# Patient Record
Sex: Male | Born: 1950 | Race: White | Hispanic: No | Marital: Married | Smoking: Former smoker
Health system: Southern US, Community
[De-identification: ages and names within clinical notes are randomized; demographics above are authoritative.]

## PROBLEM LIST (undated history)

## (undated) DIAGNOSIS — M1990 Unspecified osteoarthritis, unspecified site: Secondary | ICD-10-CM

## (undated) DIAGNOSIS — N189 Chronic kidney disease, unspecified: Secondary | ICD-10-CM

## (undated) DIAGNOSIS — I10 Essential (primary) hypertension: Secondary | ICD-10-CM

## (undated) DIAGNOSIS — K219 Gastro-esophageal reflux disease without esophagitis: Secondary | ICD-10-CM

## (undated) DIAGNOSIS — C801 Malignant (primary) neoplasm, unspecified: Secondary | ICD-10-CM

## (undated) DIAGNOSIS — I509 Heart failure, unspecified: Secondary | ICD-10-CM

## (undated) DIAGNOSIS — K5790 Diverticulosis of intestine, part unspecified, without perforation or abscess without bleeding: Secondary | ICD-10-CM

## (undated) HISTORY — DX: Gastro-esophageal reflux disease without esophagitis: K21.9

## (undated) HISTORY — DX: Unspecified osteoarthritis, unspecified site: M19.90

## (undated) HISTORY — DX: Heart failure, unspecified: I50.9

## (undated) HISTORY — DX: Essential (primary) hypertension: I10

## (undated) HISTORY — PX: POLYPECTOMY: SHX149

## (undated) HISTORY — DX: Diverticulosis of intestine, part unspecified, without perforation or abscess without bleeding: K57.90

## (undated) HISTORY — DX: Malignant (primary) neoplasm, unspecified: C80.1

## (undated) HISTORY — DX: Chronic kidney disease, unspecified: N18.9

## (undated) HISTORY — PX: EYE SURGERY: SHX253

## (undated) HISTORY — PX: COLONOSCOPY: SHX174

---

## 2018-02-09 ENCOUNTER — Encounter: Payer: Self-pay | Admitting: Gastroenterology

## 2020-05-04 ENCOUNTER — Encounter: Payer: Self-pay | Admitting: Gastroenterology

## 2020-06-22 ENCOUNTER — Other Ambulatory Visit: Payer: Self-pay

## 2020-06-22 ENCOUNTER — Ambulatory Visit (AMBULATORY_SURGERY_CENTER): Payer: Self-pay | Admitting: *Deleted

## 2020-06-22 VITALS — Ht 72.0 in | Wt 270.0 lb

## 2020-06-22 DIAGNOSIS — Z01818 Encounter for other preprocedural examination: Secondary | ICD-10-CM

## 2020-06-22 DIAGNOSIS — Z86010 Personal history of colonic polyps: Secondary | ICD-10-CM

## 2020-06-22 MED ORDER — SUTAB 1479-225-188 MG PO TABS: 24.0000 | ORAL_TABLET | ORAL | 0 refills | Status: DC

## 2020-06-22 NOTE — Progress Notes (Signed)
No egg or soy allergy known to patient  No issues with past sedation with any surgeries or procedures no intubation problems in the past  No FH of Malignant Hyperthermia No diet pills per patient No home 02 use per patient  No blood thinners per patient  Pt denies issues with constipation  No A fib or A flutter  EMMI video to pt or via MyChart  COVID 19 guidelines implemented in PV today with Pt and RN   Sutab Coupon given to pt in PV today , Code to Pharmacy   Due to the COVID-19 pandemic we are asking patients to follow these guidelines. Please only bring one care partner. Please be aware that your care partner may wait in the car in the parking lot or if they feel like they will be too hot to wait in the car, they may wait in the lobby on the 4th floor. All care partners are required to wear a mask the entire time (we do not have any that we can provide them), they need to practice social distancing, and we will do a Covid check for all patient's and care partners when you arrive. Also we will check their temperature and your temperature. If the care partner waits in their car they need to stay in the parking lot the entire time and we will call them on their cell phone when the patient is ready for discharge so they can bring the car to the front of the building. Also all patient's will need to wear a mask into building.  

## 2020-06-23 ENCOUNTER — Encounter: Payer: Self-pay | Admitting: Gastroenterology

## 2020-06-27 ENCOUNTER — Telehealth: Payer: Self-pay

## 2020-06-27 NOTE — Telephone Encounter (Signed)
Called cardiology and he cancelled and no showed 2 appointments and PCP said they don't have record of patient seeing a cardiologist . Called patient himself and asked and he said he hasn't seen a cardiologist and also that he would like to cancel his appointment for procedure. I asked him if he wanted me to cancel the appointment and he said yes so procedure and covid test are cancelled. I told him to call back when he is ready to schedule again and he said ok

## 2020-06-27 NOTE — Telephone Encounter (Signed)
Brooke,  Thanks for following up  Thanks,  [employer]

## 2020-07-04 ENCOUNTER — Encounter: Payer: Self-pay | Admitting: Gastroenterology

## 2020-07-04 NOTE — Telephone Encounter (Signed)
Thanks for letting me know RG 

## 2021-01-11 ENCOUNTER — Telehealth: Payer: Self-pay | Admitting: Cardiology

## 2021-01-11 ENCOUNTER — Encounter (HOSPITAL_COMMUNITY): Payer: Self-pay | Admitting: Cardiology

## 2021-01-11 ENCOUNTER — Inpatient Hospital Stay (HOSPITAL_COMMUNITY): Payer: Medicare Other

## 2021-01-11 ENCOUNTER — Inpatient Hospital Stay (HOSPITAL_COMMUNITY)
Admission: EM | Admit: 2021-01-11 | Discharge: 2021-01-16 | DRG: 280 | Disposition: A | Discharge status: Home / Self Care | Payer: Medicare Other | Source: Other Acute Inpatient Hospital | Attending: Cardiology | Admitting: Cardiology

## 2021-01-11 DIAGNOSIS — I214 Non-ST elevation (NSTEMI) myocardial infarction: Principal | ICD-10-CM | POA: Diagnosis present

## 2021-01-11 DIAGNOSIS — I959 Hypotension, unspecified: Secondary | ICD-10-CM | POA: Diagnosis present

## 2021-01-11 DIAGNOSIS — E669 Obesity, unspecified: Secondary | ICD-10-CM | POA: Diagnosis present

## 2021-01-11 DIAGNOSIS — Z8249 Family history of ischemic heart disease and other diseases of the circulatory system: Secondary | ICD-10-CM | POA: Diagnosis not present

## 2021-01-11 DIAGNOSIS — Z79899 Other long term (current) drug therapy: Secondary | ICD-10-CM

## 2021-01-11 DIAGNOSIS — N179 Acute kidney failure, unspecified: Secondary | ICD-10-CM | POA: Diagnosis present

## 2021-01-11 DIAGNOSIS — E1122 Type 2 diabetes mellitus with diabetic chronic kidney disease: Secondary | ICD-10-CM | POA: Diagnosis present

## 2021-01-11 DIAGNOSIS — J432 Centrilobular emphysema: Secondary | ICD-10-CM | POA: Diagnosis present

## 2021-01-11 DIAGNOSIS — Z85828 Personal history of other malignant neoplasm of skin: Secondary | ICD-10-CM

## 2021-01-11 DIAGNOSIS — I5023 Acute on chronic systolic (congestive) heart failure: Secondary | ICD-10-CM | POA: Diagnosis present

## 2021-01-11 DIAGNOSIS — Z87891 Personal history of nicotine dependence: Secondary | ICD-10-CM

## 2021-01-11 DIAGNOSIS — U071 COVID-19: Secondary | ICD-10-CM | POA: Diagnosis present

## 2021-01-11 DIAGNOSIS — I130 Hypertensive heart and chronic kidney disease with heart failure and stage 1 through stage 4 chronic kidney disease, or unspecified chronic kidney disease: Secondary | ICD-10-CM | POA: Diagnosis present

## 2021-01-11 DIAGNOSIS — N1831 Chronic kidney disease, stage 3a: Secondary | ICD-10-CM | POA: Diagnosis present

## 2021-01-11 DIAGNOSIS — E782 Mixed hyperlipidemia: Secondary | ICD-10-CM | POA: Diagnosis present

## 2021-01-11 DIAGNOSIS — R072 Precordial pain: Secondary | ICD-10-CM | POA: Diagnosis present

## 2021-01-11 DIAGNOSIS — K219 Gastro-esophageal reflux disease without esophagitis: Secondary | ICD-10-CM | POA: Diagnosis present

## 2021-01-11 DIAGNOSIS — J9601 Acute respiratory failure with hypoxia: Secondary | ICD-10-CM | POA: Diagnosis not present

## 2021-01-11 DIAGNOSIS — Z882 Allergy status to sulfonamides status: Secondary | ICD-10-CM

## 2021-01-11 DIAGNOSIS — J439 Emphysema, unspecified: Secondary | ICD-10-CM | POA: Diagnosis not present

## 2021-01-11 DIAGNOSIS — Z8701 Personal history of pneumonia (recurrent): Secondary | ICD-10-CM

## 2021-01-11 DIAGNOSIS — Z6833 Body mass index (BMI) 33.0-33.9, adult: Secondary | ICD-10-CM

## 2021-01-11 DIAGNOSIS — R0902 Hypoxemia: Secondary | ICD-10-CM | POA: Diagnosis present

## 2021-01-11 DIAGNOSIS — M1990 Unspecified osteoarthritis, unspecified site: Secondary | ICD-10-CM | POA: Diagnosis present

## 2021-01-11 DIAGNOSIS — R079 Chest pain, unspecified: Secondary | ICD-10-CM

## 2021-01-11 DIAGNOSIS — U099 Post covid-19 condition, unspecified: Secondary | ICD-10-CM | POA: Diagnosis present

## 2021-01-11 DIAGNOSIS — Z880 Allergy status to penicillin: Secondary | ICD-10-CM

## 2021-01-11 DIAGNOSIS — J8410 Pulmonary fibrosis, unspecified: Secondary | ICD-10-CM | POA: Diagnosis present

## 2021-01-11 DIAGNOSIS — Z79891 Long term (current) use of opiate analgesic: Secondary | ICD-10-CM

## 2021-01-11 DIAGNOSIS — R0602 Shortness of breath: Secondary | ICD-10-CM

## 2021-01-11 DIAGNOSIS — I25110 Atherosclerotic heart disease of native coronary artery with unstable angina pectoris: Secondary | ICD-10-CM | POA: Diagnosis not present

## 2021-01-11 DIAGNOSIS — L4050 Arthropathic psoriasis, unspecified: Secondary | ICD-10-CM | POA: Diagnosis present

## 2021-01-11 DIAGNOSIS — E871 Hypo-osmolality and hyponatremia: Secondary | ICD-10-CM | POA: Diagnosis present

## 2021-01-11 DIAGNOSIS — I2510 Atherosclerotic heart disease of native coronary artery without angina pectoris: Secondary | ICD-10-CM | POA: Diagnosis present

## 2021-01-11 LAB — LIPID PANEL
Cholesterol: 179 mg/dL (ref 0–200)
HDL: 49 mg/dL (ref >40)
LDL Cholesterol: 109 mg/dL — ABNORMAL HIGH (ref 0–99)
Total CHOL/HDL Ratio: 3.7 RATIO
Triglycerides: 107 mg/dL (ref <150)
VLDL: 21 mg/dL (ref 0–40)

## 2021-01-11 LAB — CBC
HCT: 37.4 % — ABNORMAL LOW (ref 39.0–52.0)
Hemoglobin: 12.9 g/dL — ABNORMAL LOW (ref 13.0–17.0)
MCH: 32.3 pg (ref 26.0–34.0)
MCHC: 34.5 g/dL (ref 30.0–36.0)
MCV: 93.5 fL (ref 80.0–100.0)
Platelets: 211 10*3/uL (ref 150–400)
RBC: 4 MIL/uL — ABNORMAL LOW (ref 4.22–5.81)
RDW: 13.2 % (ref 11.5–15.5)
WBC: 7.9 10*3/uL (ref 4.0–10.5)
nRBC: 0 % (ref 0.0–0.2)

## 2021-01-11 LAB — COMPREHENSIVE METABOLIC PANEL
ALT: 37 U/L (ref 0–44)
AST: 72 U/L — ABNORMAL HIGH (ref 15–41)
Albumin: 3 g/dL — ABNORMAL LOW (ref 3.5–5.0)
Alkaline Phosphatase: 63 U/L (ref 38–126)
Anion gap: 9 (ref 5–15)
BUN: 22 mg/dL (ref 8–23)
CO2: 23 mmol/L (ref 22–32)
Calcium: 8.6 mg/dL — ABNORMAL LOW (ref 8.9–10.3)
Chloride: 100 mmol/L (ref 98–111)
Creatinine, Ser: 1.61 mg/dL — ABNORMAL HIGH (ref 0.61–1.24)
GFR, Estimated: 46 mL/min — ABNORMAL LOW (ref >60)
Glucose, Bld: 114 mg/dL — ABNORMAL HIGH (ref 70–99)
Potassium: 4.5 mmol/L (ref 3.5–5.1)
Sodium: 132 mmol/L — ABNORMAL LOW (ref 135–145)
Total Bilirubin: 1 mg/dL (ref 0.3–1.2)
Total Protein: 5.9 g/dL — ABNORMAL LOW (ref 6.5–8.1)

## 2021-01-11 LAB — BRAIN NATRIURETIC PEPTIDE: B Natriuretic Peptide: 491.7 pg/mL — ABNORMAL HIGH (ref 0.0–100.0)

## 2021-01-11 LAB — HEMOGLOBIN A1C
Hgb A1c MFr Bld: 5.3 % (ref 4.8–5.6)
Mean Plasma Glucose: 105.41 mg/dL

## 2021-01-11 LAB — TROPONIN I (HIGH SENSITIVITY): Troponin I (High Sensitivity): 12486 ng/L (ref <18)

## 2021-01-11 LAB — HIV ANTIBODY (ROUTINE TESTING W REFLEX): HIV Screen 4th Generation wRfx: NONREACTIVE

## 2021-01-11 LAB — HEPARIN LEVEL (UNFRACTIONATED): Heparin Unfractionated: 0.24 IU/mL — ABNORMAL LOW (ref 0.30–0.70)

## 2021-01-11 MED ORDER — FUROSEMIDE 10 MG/ML IJ SOLN
40.0000 mg | Freq: Two times a day (BID) | INTRAMUSCULAR | Status: DC
  Administered 2021-01-11 – 2021-01-14 (×4): 40 mg via INTRAVENOUS
  Filled 2021-01-11 (×5): qty 4

## 2021-01-11 MED ORDER — NITROGLYCERIN 0.4 MG SL SUBL: 0.4000 mg | SUBLINGUAL_TABLET | SUBLINGUAL | Status: DC | PRN

## 2021-01-11 MED ORDER — LISINOPRIL 20 MG PO TABS
20.0000 mg | ORAL_TABLET | Freq: Every day | ORAL | Status: DC
  Administered 2021-01-12: 20 mg via ORAL
  Filled 2021-01-11: qty 1

## 2021-01-11 MED ORDER — HYDROCODONE-ACETAMINOPHEN 10-325 MG PO TABS
1.0000 | ORAL_TABLET | Freq: Four times a day (QID) | ORAL | Status: DC | PRN
Start: 2021-01-11 — End: 2021-01-16
  Administered 2021-01-12 – 2021-01-15 (×2): 1 via ORAL
  Filled 2021-01-11 (×2): qty 1

## 2021-01-11 MED ORDER — CARVEDILOL 6.25 MG PO TABS
6.2500 mg | ORAL_TABLET | Freq: Two times a day (BID) | ORAL | Status: DC
  Administered 2021-01-11 – 2021-01-12 (×3): 6.25 mg via ORAL
  Filled 2021-01-11 (×3): qty 1

## 2021-01-11 MED ORDER — DOXAZOSIN MESYLATE 2 MG PO TABS
2.0000 mg | ORAL_TABLET | Freq: Every day | ORAL | Status: DC
  Administered 2021-01-12 – 2021-01-13 (×2): 2 mg via ORAL
  Filled 2021-01-11 (×3): qty 1

## 2021-01-11 MED ORDER — ACETAMINOPHEN 325 MG PO TABS: 650.0000 mg | ORAL_TABLET | ORAL | Status: DC | PRN

## 2021-01-11 MED ORDER — ASPIRIN 300 MG RE SUPP
300.0000 mg | RECTAL | Status: AC
  Filled 2021-01-11: qty 1

## 2021-01-11 MED ORDER — HEPARIN (PORCINE) 25000 UT/250ML-% IV SOLN
1800.0000 [IU]/h | INTRAVENOUS | Status: DC
  Administered 2021-01-11: 1500 [IU]/h via INTRAVENOUS
  Administered 2021-01-12: 1800 [IU]/h via INTRAVENOUS
  Filled 2021-01-11 (×2): qty 250

## 2021-01-11 MED ORDER — NITROGLYCERIN IN D5W 200-5 MCG/ML-% IV SOLN: 10.0000 ug/min | INTRAVENOUS | Status: DC

## 2021-01-11 MED ORDER — ASPIRIN EC 81 MG PO TBEC
81.0000 mg | DELAYED_RELEASE_TABLET | Freq: Every day | ORAL | Status: DC
  Administered 2021-01-13 – 2021-01-16 (×4): 81 mg via ORAL
  Filled 2021-01-11 (×6): qty 1

## 2021-01-11 MED ORDER — ONDANSETRON HCL 4 MG/2ML IJ SOLN: 4.0000 mg | Freq: Four times a day (QID) | INTRAMUSCULAR | Status: DC | PRN

## 2021-01-11 MED ORDER — ASPIRIN 81 MG PO CHEW
324.0000 mg | CHEWABLE_TABLET | ORAL | Status: AC
  Administered 2021-01-11: 324 mg via ORAL
  Filled 2021-01-11: qty 4

## 2021-01-11 MED ORDER — PANTOPRAZOLE SODIUM 40 MG PO TBEC
40.0000 mg | DELAYED_RELEASE_TABLET | Freq: Every day | ORAL | Status: DC
  Administered 2021-01-12 – 2021-01-16 (×5): 40 mg via ORAL
  Filled 2021-01-11 (×5): qty 1

## 2021-01-11 NOTE — H&P (Addendum)
Darrell Simpson is an 70 y.o. male.   Chief Complaint: Chest pain PCP: Antonietta Jewel, MD  HPI:   70 year old Caucasian male with hypertension, CKD, h/o squamous cell skin cancer, h/o COVID pneumonia in 11/2020, admitted with chest pain.  Patient is a retired Educational psychologist, works for [employer].  Patient had been in his usual state of health until 01/10/2021, when he developed retrosternal chest pain radiating to left arm and jaw, associated worsening shortness of breath.  Episode lasted for close to 2 hours.  Patient went to [facility] ED, where he underwent work-up that showed elevated troponin suggestive of acute coronary syndrome, age-indeterminate anterolateral infarct.  He also underwent CT chest angiogram that showed significant pulmonary abnormalities as detailed below, but no pulmonary embolism.  Work-up also showed hyponatremia at 126, elevated creatinine at 1.6.  Patient was transferred for further management of acute coronary syndrome.  After delay of close to 20 hours due to in availability of bed and/or transport, patient finally arrived at [facility] at around 5 PM on 01/11/2021.  At this time, patient is chest pain-free.  At rest, he denies any shortness of breath.  However, he does have baseline exertional dyspnea for last 3 years.  He has never had thorough pulmonary work-up.  He also reports exertional anginal symptoms for last 4 years.  Of note, he had COVID back in 11/2020. Antigen test was negative at [place] on 01/10/2021. PCR is still positive here. Confirmed with infection prevention. He does not need to be in isolation.    Past Medical History:  Diagnosis Date  . Arthritis    psioriatic arthritis   . Cancer ([place])    squamous cell skin cancer right cheek   . CHF (congestive heart failure) ([place])   . Chronic kidney disease    back to 80% func- improving per pt   . Diverticulosis   . GERD (gastroesophageal reflux disease)   . Hypertension     Past Surgical History:   Procedure Laterality Date  . COLONOSCOPY    . EYE SURGERY     age 39   . POLYPECTOMY       Family History  Problem Relation Age of Onset  . CAD Father   . Congestive Heart Failure Father   . Colon cancer Neg Hx   . Colon polyps Neg Hx   . Esophageal cancer Neg Hx   . Rectal cancer Neg Hx   . Stomach cancer Neg Hx     Social History:  reports that he quit smoking about 6 years ago. His smoking use included cigarettes. He has a 67.50 pack-year smoking history. He has quit using smokeless tobacco. He reports previous alcohol use of about 12.0 standard drinks of alcohol per week. He reports that he does not use drugs.  Allergies:  Allergies  Allergen Reactions  . Penicillins Rash  . Sulfa Antibiotics Rash    Review of Systems  Constitutional: Negative for decreased appetite, malaise/fatigue, weight gain and weight loss.  HENT: Negative for congestion.   Eyes: Negative for visual disturbance.  Cardiovascular: Positive for chest pain (Now resolved) and dyspnea on exertion. Negative for leg swelling, palpitations and syncope.  Respiratory: Negative for cough.   Endocrine: Negative for cold intolerance.  Hematologic/Lymphatic: Does not bruise/bleed easily.  Skin: Negative for itching and rash.  Musculoskeletal: Negative for myalgias.  Gastrointestinal: Negative for abdominal pain, nausea and vomiting.  Genitourinary: Negative for dysuria.  Neurological: Negative for dizziness and weakness.  Psychiatric/Behavioral: The patient  is not nervous/anxious.   All other systems reviewed and are negative.    Blood pressure 111/72, pulse 80, resp. rate (!) 22, height 6' (1.829 m), weight 112.1 kg, SpO2 97 %. Body mass index is 33.51 kg/m.  Physical Exam Vitals and nursing note reviewed.  Constitutional:      General: He is not in acute distress.    Appearance: He is well-developed.  HENT:     Head: Normocephalic and atraumatic.  Eyes:     Conjunctiva/sclera: Conjunctivae  normal.     Pupils: Pupils are equal, round, and reactive to light.  Neck:     Vascular: No JVD.  Cardiovascular:     Rate and Rhythm: Normal rate and regular rhythm.     Pulses: Normal pulses and intact distal pulses.     Heart sounds: No murmur heard.   Pulmonary:     Effort: Pulmonary effort is normal.     Breath sounds: Normal breath sounds. No wheezing or rales.  Abdominal:     General: Bowel sounds are normal.     Palpations: Abdomen is soft.     Tenderness: There is no rebound.  Musculoskeletal:        General: No tenderness. Normal range of motion.     Right lower leg: No edema.     Left lower leg: No edema.  Lymphadenopathy:     Cervical: No cervical adenopathy.  Skin:    General: Skin is warm and dry.  Neurological:     Mental Status: He is alert and oriented to person, place, and time.     Cranial Nerves: No cranial nerve deficit.     Results for orders placed or performed during the hospital encounter of 01/11/21 (from the past 48 hour(s))  HIV Antibody (routine testing w rflx)     Status: None   Collection Time: 01/11/21  5:06 PM  Result Value Ref Range   HIV Screen 4th Generation wRfx Non Reactive Non Reactive    Comment: Performed at [facility], [place] [address], [place], [place] [address]  Troponin I (High Sensitivity)     Status: Abnormal   Collection Time: 01/11/21  5:06 PM  Result Value Ref Range   Troponin I (High Sensitivity) 12,486 (HH) <18 ng/L    Comment: CRITICAL RESULT CALLED TO, READ BACK BY AND VERIFIED WITH: M.SCHULL RN 1847 01/11/21 MCCORMICK K (NOTE) Elevated high sensitivity troponin I (hsTnI) values and significant  changes across serial measurements may suggest ACS but many other  chronic and acute conditions are known to elevate hsTnI results.  Refer to the Links section for chest pain algorithms and additional  guidance. Performed at [facility], [place] [address], [place], [place] [address]     Labs:  No results  found for: WBC, HGB, HCT, MCV, PLT No results for input(s): NA, K, CL, CO2, BUN, CREATININE, CALCIUM, PROT, BILITOT, ALKPHOS, ALT, AST, GLUCOSE in the last 168 hours.  Invalid input(s): LABALBU  Lipid Panel  No results found for: CHOL, TRIG, HDL, CHOLHDL, VLDL, LDLCALC  BNP (last 3 results) No results for input(s): BNP in the last 8760 hours.  HEMOGLOBIN A1C No results found for: HGBA1C, MPG  Cardiac Panel (last 3 results) No results for input(s): CKTOTAL, CKMB, RELINDX in the last 8760 hours.  Invalid input(s): TROPONINHS  No results found for: CKTOTAL, CKMB, CKMBINDEX   TSH No results for input(s): TSH in the last 8760 hours.   Medications Prior to Admission  Medication  Sig Dispense Refill  . carvedilol (COREG) 6.25 MG tablet Take 6.25 mg by mouth 2 (two) times daily.    . Cyanocobalamin (VITAMIN B 12) 500 MCG TABS Take by mouth as needed. Pt takes 1-2 x a month    . doxazosin (CARDURA) 2 MG tablet Take by mouth.    . furosemide (LASIX) 20 MG tablet Take 20 mg by mouth 3 (three) times daily.    Marland Kitchen HYDROcodone-acetaminophen (NORCO) 10-325 MG tablet Take by mouth.    Marland Kitchen lisinopril (ZESTRIL) 20 MG tablet Take 20 mg by mouth daily.    Marland Kitchen omeprazole (PRILOSEC) 20 MG capsule Take 20 mg by mouth as needed.     . Sodium Sulfate-Mag Sulfate-KCl (SUTAB) [phone] MG TABS Take 24 tablets by mouth as directed. BIN: [id number] PCN: CN GROUP: [id number] MEMBER ID: [id number];OXB AS CASH;NO PRIOR AUTHORIZATION 24 tablet 0  . triamcinolone cream (KENALOG) 0.1 % SMARTSIG:1 Application Topical 2-3 Times Daily    . Vitamin D, Ergocalciferol, (DRISDOL) 1.25 MG (50000 UNIT) CAPS capsule Take 50,000 Units by mouth once a week.        Current Facility-Administered Medications:  .  acetaminophen (TYLENOL) tablet 650 mg, 650 mg, Oral, Q4H PRN, Kewana Sanon J, MD .  aspirin chewable tablet 324 mg, 324 mg, Oral, NOW **OR** aspirin suppository 300 mg, 300 mg, Rectal, NOW, Jaevin Medearis J,  MD .  Derrill Memo ON 01/12/2021] aspirin EC tablet 81 mg, 81 mg, Oral, Daily, Meridith Romick J, MD .  nitroGLYCERIN (NITROSTAT) SL tablet 0.4 mg, 0.4 mg, Sublingual, Q5 Min x 3 PRN, Abaigeal Moomaw J, MD .  ondansetron (ZOFRAN) injection 4 mg, 4 mg, Intravenous, Q6H PRN, Alyssabeth Bruster J, MD   Today's Vitals   01/11/21 1617  BP: 111/72  Pulse: 80  Resp: (!) 22  SpO2: 97%  Weight: 112.1 kg  Height: 6' (1.829 m)   Body mass index is 33.51 kg/m.   CXR 01/10/2021: Bibasilar interstitial and alveolar opacities compatible w/h/o COVID   CARDIAC STUDIES:  EKG 01/11/3021: Sinus rhythm LAFB Anterolateral infarct, age indeterminate  Echocardiogram: Pending  CTA Chest 3/2/202: No PE Patchy peripheral ground-glass opacities and subpleural reticulation, w/some associated architectural distortion b/l, suggesting persistent multifocal pneumonia superimposed on interstitial lung disease (UIP) and/or chronic post infectious change Numerous small and moderately enlarged lymph nodes throughout the mediastinum and b/l perihilar regions, likely reactive. Neoplastic process not excluded, but considered less likely Diffuse coronary artery calcifications  Recent labs: 01/10/2021: Glucose 218, BUN/Cr 23/1.6. EGFR 43. Na/K 126/4.8. Rest of the CMP normal H/H 14/40. MCV 94. Platelets 235 HbA1C N/A Lipid panel N/A TSH N/A Ur osm 254 (low) COVID Ag test negative (At [facility])  Assessment/Plan  70 year old Caucasian male with hypertension, CKD, h/o squamous cell skin cancer, h/o COVID pneumonia in 11/2020, likely underlying interstitial lung disease, admitted with non-STEMI  Non-STEMI: High-sensitivity troponin 12,000 Currently chest pain-free.  Age-indeterminate anterolateral infarct and LAFB on EKG. Recommend aspirin, heparin, beta-blocker, statin. Echocardiogram pending. Plan for coronary angiogram and possible intervention on 01/12/2021. Given patient's possible underlying interstitial  lung disease, he is less likely to be a good surgical candidate, in case of severe multivessel disease. Recommend gentle hydration with 50 cc an hour for next 12 hours  Hyponatremia: Sodium 126 at [place].  Suspect dilutional hyponatremia given urine osmolality of 254. We will judiciously use Lasix.  CKD: Stage III.  GFR 43.  Gentle hydration and judicious use of Lasix, to maintain urine output.  Interstitial lung disease: History of COVID and  possible underlying interstitial lung disease.   Appreciate pulmonary input.  Of note, he had COVID back in 11/2020. Antigen test was negative at [place] on 01/10/2021. PCR is still positive here. Confirmed with infection prevention. He does not need to be in isolation.  Reviewed outside records Time spent: 90-minute   Nigel Mormon, MD Pager: [phone] Office: [phone]

## 2021-01-11 NOTE — Telephone Encounter (Signed)
Received a call from [facility]. Presentation with chest pain, currently chest pain free. Trop 4 (old assay). Awaiting transfer subject to bed availability, Continue Aspirin, heparin, statin, beta blocker.   Nigel Mormon, MD Pager: [phone] Office: [phone]

## 2021-01-11 NOTE — Progress Notes (Addendum)
ANTICOAGULATION CONSULT NOTE - Initial Consult  Pharmacy Consult for heparin  Indication: chest pain/ACS  Allergies  Allergen Reactions  . Penicillins Rash  . Sulfa Antibiotics Rash    Patient Measurements: Height: 6' (182.9 cm) Weight: 112.1 kg (247 lb 1.6 oz) IBW/kg (Calculated) : 77.6 HEPARIN DW (KG): 101.5   Vital Signs:    Labs: No results for input(s): HGB, HCT, PLT, APTT, LABPROT, INR, HEPARINUNFRC, HEPRLOWMOCWT, CREATININE, CKTOTAL, CKMB, TROPONINIHS in the last 72 hours.  CrCl cannot be calculated (No successful lab value found.).   Medical History: Past Medical History:  Diagnosis Date  . Arthritis    psioriatic arthritis   . Cancer ([place])    squamous cell skin cancer right cheek   . CHF (congestive heart failure) ([place])   . Chronic kidney disease    back to 80% func- improving per pt   . Diverticulosis   . GERD (gastroesophageal reflux disease)   . Hypertension     Medications:  Medications Prior to Admission  Medication Sig Dispense Refill Last Dose  . carvedilol (COREG) 6.25 MG tablet Take 6.25 mg by mouth 2 (two) times daily.     . Cyanocobalamin (VITAMIN B 12) 500 MCG TABS Take by mouth as needed. Pt takes 1-2 x a month     . doxazosin (CARDURA) 2 MG tablet Take by mouth.     . furosemide (LASIX) 20 MG tablet Take 20 mg by mouth 3 (three) times daily.     Marland Kitchen HYDROcodone-acetaminophen (NORCO) 10-325 MG tablet Take by mouth.     Marland Kitchen lisinopril (ZESTRIL) 20 MG tablet Take 20 mg by mouth daily.     Marland Kitchen omeprazole (PRILOSEC) 20 MG capsule Take 20 mg by mouth as needed.      . Sodium Sulfate-Mag Sulfate-KCl (SUTAB) [phone] MG TABS Take 24 tablets by mouth as directed. BIN: [id number] PCN: CN GROUP: [id number] MEMBER ID: [id number];DVV AS CASH;NO PRIOR AUTHORIZATION 24 tablet 0   . triamcinolone cream (KENALOG) 0.1 % SMARTSIG:1 Application Topical 2-3 Times Daily     . Vitamin D, Ergocalciferol, (DRISDOL) 1.25 MG (50000 UNIT) CAPS capsule Take 50,000 Units by  mouth once a week.      Scheduled:  . aspirin  324 mg Oral NOW   Or  . aspirin  300 mg Rectal NOW  . [START ON 01/12/2021] aspirin EC  81 mg Oral Daily    Assessment: 70 yo male from [facility] hospital with CP. Pharmacy consulted to dose heparin. He is currently in heparin at 1500 units/hr   Goal of Therapy:  Heparin level 0.3-0.7 units/ml Monitor platelets by anticoagulation protocol: Yes   Plan:  -Continue heparin at 1500 units/hr -Check heparin level later tonight -Daily heparin level and CBC  Hildred Laser, PharmD Clinical Pharmacist **Pharmacist phone directory can now be found on [email or web address] ([email or web address]).  Listed under [place].  Addendum -heparin level= 0.24  Plan  -Increase heparin to 1700 units/hr -Heparin level in 6 hours and daily wth CBC daily  Hildred Laser, PharmD Clinical Pharmacist **Pharmacist phone directory can now be found on [email or web address] (PW [email or web address]).  Listed under [place].

## 2021-01-12 ENCOUNTER — Inpatient Hospital Stay (HOSPITAL_COMMUNITY): Payer: Medicare Other

## 2021-01-12 ENCOUNTER — Telehealth: Payer: Self-pay | Admitting: Internal Medicine

## 2021-01-12 ENCOUNTER — Ambulatory Visit (HOSPITAL_COMMUNITY): Admit: 2021-01-12 | Payer: Medicare Other | Admitting: Cardiology

## 2021-01-12 ENCOUNTER — Encounter (HOSPITAL_COMMUNITY)
Admission: EM | Disposition: A | Discharge status: Home / Self Care | Payer: Self-pay | Source: Other Acute Inpatient Hospital | Attending: Cardiology

## 2021-01-12 ENCOUNTER — Other Ambulatory Visit: Payer: Self-pay

## 2021-01-12 ENCOUNTER — Encounter (HOSPITAL_COMMUNITY): Payer: Self-pay | Admitting: Cardiology

## 2021-01-12 DIAGNOSIS — J432 Centrilobular emphysema: Secondary | ICD-10-CM

## 2021-01-12 DIAGNOSIS — U071 COVID-19: Secondary | ICD-10-CM

## 2021-01-12 DIAGNOSIS — I214 Non-ST elevation (NSTEMI) myocardial infarction: Principal | ICD-10-CM

## 2021-01-12 DIAGNOSIS — J9601 Acute respiratory failure with hypoxia: Secondary | ICD-10-CM | POA: Diagnosis not present

## 2021-01-12 HISTORY — PX: LEFT HEART CATH AND CORONARY ANGIOGRAPHY: CATH118249

## 2021-01-12 LAB — BASIC METABOLIC PANEL
Anion gap: 10 (ref 5–15)
BUN: 20 mg/dL (ref 8–23)
CO2: 20 mmol/L — ABNORMAL LOW (ref 22–32)
Calcium: 8.7 mg/dL — ABNORMAL LOW (ref 8.9–10.3)
Chloride: 101 mmol/L (ref 98–111)
Creatinine, Ser: 1.41 mg/dL — ABNORMAL HIGH (ref 0.61–1.24)
GFR, Estimated: 54 mL/min — ABNORMAL LOW (ref >60)
Glucose, Bld: 103 mg/dL — ABNORMAL HIGH (ref 70–99)
Potassium: 4.4 mmol/L (ref 3.5–5.1)
Sodium: 131 mmol/L — ABNORMAL LOW (ref 135–145)

## 2021-01-12 LAB — CBC WITH DIFFERENTIAL/PLATELET
Abs Immature Granulocytes: 0.04 10*3/uL (ref 0.00–0.07)
Basophils Absolute: 0.1 10*3/uL (ref 0.0–0.1)
Basophils Relative: 1 %
Eosinophils Absolute: 0.2 10*3/uL (ref 0.0–0.5)
Eosinophils Relative: 2 %
HCT: 37.5 % — ABNORMAL LOW (ref 39.0–52.0)
Hemoglobin: 12.9 g/dL — ABNORMAL LOW (ref 13.0–17.0)
Immature Granulocytes: 1 %
Lymphocytes Relative: 25 %
Lymphs Abs: 2.1 10*3/uL (ref 0.7–4.0)
MCH: 32.2 pg (ref 26.0–34.0)
MCHC: 34.4 g/dL (ref 30.0–36.0)
MCV: 93.5 fL (ref 80.0–100.0)
Monocytes Absolute: 0.8 10*3/uL (ref 0.1–1.0)
Monocytes Relative: 9 %
Neutro Abs: 5.3 10*3/uL (ref 1.7–7.7)
Neutrophils Relative %: 62 %
Platelets: 198 10*3/uL (ref 150–400)
RBC: 4.01 MIL/uL — ABNORMAL LOW (ref 4.22–5.81)
RDW: 13.4 % (ref 11.5–15.5)
WBC: 8.4 10*3/uL (ref 4.0–10.5)
nRBC: 0 % (ref 0.0–0.2)

## 2021-01-12 LAB — ECHOCARDIOGRAM COMPLETE
AR max vel: 2.35 cm2
AV Area VTI: 2.6 cm2
AV Area mean vel: 2.41 cm2
AV Mean grad: 4 mmHg
AV Peak grad: 7.3 mmHg
Ao pk vel: 1.35 m/s
Area-P 1/2: 2.09 cm2
Height: 72 in
S' Lateral: 3.6 cm
Weight: 3886.4 oz

## 2021-01-12 LAB — SARS CORONAVIRUS 2 (TAT 6-24 HRS): SARS Coronavirus 2: POSITIVE — AB

## 2021-01-12 LAB — HEPARIN LEVEL (UNFRACTIONATED): Heparin Unfractionated: 0.29 IU/mL — ABNORMAL LOW (ref 0.30–0.70)

## 2021-01-12 SURGERY — LEFT HEART CATH AND CORONARY ANGIOGRAPHY
Anesthesia: LOCAL

## 2021-01-12 MED ORDER — ALBUTEROL SULFATE HFA 108 (90 BASE) MCG/ACT IN AERS
1.0000 | INHALATION_SPRAY | RESPIRATORY_TRACT | Status: DC | PRN
  Filled 2021-01-12: qty 6.7

## 2021-01-12 MED ORDER — HEPARIN (PORCINE) IN NACL 1000-0.9 UT/500ML-% IV SOLN
INTRAVENOUS | Status: DC | PRN
  Administered 2021-01-12 (×2): 500 mL

## 2021-01-12 MED ORDER — HEPARIN (PORCINE) 25000 UT/250ML-% IV SOLN
1900.0000 [IU]/h | INTRAVENOUS | Status: DC
  Administered 2021-01-12: 23:00:00 1800 [IU]/h via INTRAVENOUS
  Filled 2021-01-12 (×3): qty 250

## 2021-01-12 MED ORDER — FENTANYL CITRATE (PF) 100 MCG/2ML IJ SOLN
INTRAMUSCULAR | Status: AC
  Filled 2021-01-12: qty 2

## 2021-01-12 MED ORDER — ONDANSETRON HCL 4 MG/2ML IJ SOLN: 4.0000 mg | Freq: Four times a day (QID) | INTRAMUSCULAR | Status: DC | PRN

## 2021-01-12 MED ORDER — VERAPAMIL HCL 2.5 MG/ML IV SOLN
INTRAVENOUS | Status: AC
  Filled 2021-01-12: qty 2

## 2021-01-12 MED ORDER — HEPARIN (PORCINE) IN NACL 1000-0.9 UT/500ML-% IV SOLN
INTRAVENOUS | Status: AC
  Filled 2021-01-12: qty 1000

## 2021-01-12 MED ORDER — LIDOCAINE HCL (PF) 1 % IJ SOLN
INTRAMUSCULAR | Status: AC
  Filled 2021-01-12: qty 30

## 2021-01-12 MED ORDER — SODIUM CHLORIDE 0.9 % IV SOLN
INTRAVENOUS | Status: AC
  Administered 2021-01-12 (×2): 250 mL via INTRAVENOUS

## 2021-01-12 MED ORDER — MIDAZOLAM HCL 2 MG/2ML IJ SOLN
INTRAMUSCULAR | Status: DC | PRN
  Administered 2021-01-12: 1 mg via INTRAVENOUS

## 2021-01-12 MED ORDER — ASPIRIN 81 MG PO CHEW
81.0000 mg | CHEWABLE_TABLET | ORAL | Status: AC
  Administered 2021-01-12: 81 mg via ORAL
  Filled 2021-01-12: qty 1

## 2021-01-12 MED ORDER — FENTANYL CITRATE (PF) 100 MCG/2ML IJ SOLN
INTRAMUSCULAR | Status: DC | PRN
  Administered 2021-01-12: 50 ug via INTRAVENOUS

## 2021-01-12 MED ORDER — SODIUM CHLORIDE 0.9% FLUSH
3.0000 mL | INTRAVENOUS | Status: DC | PRN
  Administered 2021-01-12: 3 mL via INTRAVENOUS

## 2021-01-12 MED ORDER — PERFLUTREN LIPID MICROSPHERE
1.0000 mL | INTRAVENOUS | Status: AC | PRN
  Administered 2021-01-12: 3 mL via INTRAVENOUS
  Filled 2021-01-12: qty 10

## 2021-01-12 MED ORDER — LABETALOL HCL 5 MG/ML IV SOLN: 10.0000 mg | INTRAVENOUS | Status: AC | PRN

## 2021-01-12 MED ORDER — MIDAZOLAM HCL 2 MG/2ML IJ SOLN
INTRAMUSCULAR | Status: AC
  Filled 2021-01-12: qty 2

## 2021-01-12 MED ORDER — VERAPAMIL HCL 2.5 MG/ML IV SOLN
INTRAVENOUS | Status: DC | PRN
  Administered 2021-01-12 (×3): 10 mL via INTRA_ARTERIAL

## 2021-01-12 MED ORDER — ONDANSETRON HCL 4 MG/2ML IJ SOLN
INTRAMUSCULAR | Status: AC
  Filled 2021-01-12: qty 2

## 2021-01-12 MED ORDER — SODIUM CHLORIDE 0.9 % IV SOLN: 250.0000 mL | INTRAVENOUS | Status: DC | PRN

## 2021-01-12 MED ORDER — ONDANSETRON HCL 4 MG/2ML IJ SOLN
INTRAMUSCULAR | Status: DC | PRN
  Administered 2021-01-12: 4 mg via INTRAVENOUS

## 2021-01-12 MED ORDER — SODIUM CHLORIDE 0.9% FLUSH: 3.0000 mL | INTRAVENOUS | Status: DC | PRN

## 2021-01-12 MED ORDER — HEPARIN SODIUM (PORCINE) 1000 UNIT/ML IJ SOLN
INTRAMUSCULAR | Status: AC
  Filled 2021-01-12: qty 1

## 2021-01-12 MED ORDER — HEPARIN SODIUM (PORCINE) 1000 UNIT/ML IJ SOLN
INTRAMUSCULAR | Status: DC | PRN
  Administered 2021-01-12: 6000 [IU] via INTRAVENOUS

## 2021-01-12 MED ORDER — SODIUM CHLORIDE 0.9 % IV SOLN: INTRAVENOUS | Status: AC

## 2021-01-12 MED ORDER — SODIUM CHLORIDE 0.9% FLUSH
3.0000 mL | Freq: Two times a day (BID) | INTRAVENOUS | Status: DC
  Administered 2021-01-12 – 2021-01-15 (×7): 3 mL via INTRAVENOUS

## 2021-01-12 MED ORDER — SODIUM CHLORIDE 0.9% FLUSH
3.0000 mL | Freq: Two times a day (BID) | INTRAVENOUS | Status: DC
  Administered 2021-01-11 – 2021-01-12 (×2): 3 mL via INTRAVENOUS

## 2021-01-12 MED ORDER — IOHEXOL 350 MG/ML SOLN
INTRAVENOUS | Status: DC | PRN
  Administered 2021-01-12: 75 mL via INTRA_ARTERIAL

## 2021-01-12 MED ORDER — LIDOCAINE HCL (PF) 1 % IJ SOLN
INTRAMUSCULAR | Status: DC | PRN
  Administered 2021-01-12: 1 mL

## 2021-01-12 MED ORDER — ACETAMINOPHEN 325 MG PO TABS: 650.0000 mg | ORAL_TABLET | ORAL | Status: DC | PRN

## 2021-01-12 MED ORDER — HYDRALAZINE HCL 20 MG/ML IJ SOLN: 10.0000 mg | INTRAMUSCULAR | Status: AC | PRN

## 2021-01-12 SURGICAL SUPPLY — 15 items
CATH 5FR JL3.5 JR4 ANG PIG MP (CATHETERS) ×2 IMPLANT
CATH INFINITI 5 FR 3DRC (CATHETERS) ×2 IMPLANT
CATH INFINITI 5 FR AL2 (CATHETERS) ×2 IMPLANT
CATH INFINITI 5FR AL1 (CATHETERS) ×2 IMPLANT
CATH INFINITI 5FR JL4 (CATHETERS) ×2 IMPLANT
CATH LAUNCHER 5F RADR (CATHETERS) ×1 IMPLANT
CATHETER LAUNCHER 5F RADR (CATHETERS) ×2
DEVICE RAD COMP TR BAND LRG (VASCULAR PRODUCTS) ×2 IMPLANT
GLIDESHEATH SLEND A-KIT 6F 22G (SHEATH) ×2 IMPLANT
GUIDEWIRE INQWIRE 1.5J.035X260 (WIRE) ×1 IMPLANT
INQWIRE 1.5J .035X260CM (WIRE) ×2
KIT HEART LEFT (KITS) ×2 IMPLANT
PACK CARDIAC CATHETERIZATION (CUSTOM PROCEDURE TRAY) ×2 IMPLANT
TRANSDUCER W/STOPCOCK (MISCELLANEOUS) ×2 IMPLANT
TUBING CIL FLEX 10 FLL-RA (TUBING) ×2 IMPLANT

## 2021-01-12 NOTE — Consult Note (Signed)
NAME:  Darrell Simpson, MRN:  [id number], DOB:  [birth year], LOS: 1 ADMISSION DATE:  01/11/2021, CONSULTATION DATE:  01/12/21 REFERRING MD:  Virgina Jock - Cards, CHIEF COMPLAINT:  Dyspnea, Hx COVID   Brief History:  70 yo M admitted to [facility] from [place] for NSTEMI. Hx COVID (+ 11/12/20) and has had persistent dyspnea. CCM consult in this setting  History of Present Illness:  70 yo M PMH HTN, CKD, recent covid PNA who transferred to [facility] 3/3 from [place] for Chest pain-- admitted to cards service for NSTEMI. Chest discomfort has been longstanding with varying qualities (burning, cramping, pain) x 3+ years. Assocaited dyspnea for 2-3 years most notably DOE. Quit smoking 8 years ago (formerly was 1ppd x "over 20 years") but did start vaping when he quit smoking. Quit smoking Jan 2022 after contracting COVID. Recent Hx COVID (positive 11/12/20), after which pt was started on PRN albuterol which he does not feel helps his SOB. 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   CCM consulted 3/4 for dyspnea.   Past Medical History:  CKD Squamous cell carcinoma  Hx COVID PNA Psoriatic arthritis GERD HTN \\CHF  Diverticulosis  Hx tobacco dependence  Significant Hospital Events:  3/3 transferred from [facility] hospital to [facility] for NSTEMI. 3/4 PCCM consult for DOE, CTA chest findings. Plan for LHC with cards  Consults:  PCCM  Procedures:    Significant Diagnostic Tests:  3/2 CTA chest at OSH: Emphysematous changes. patchy bilateral GGO and subpleural reticulation.  No evidence of PE on CTA. Numerous small and moderately enlarged mediastinal lymph notes, perihilar nodes.   Micro Data:  COVID PCR 3/3 positive >> d/w IP >> pt does NOT require precautions (covid  + 11/12/20)   Antimicrobials:    Interim History / Subjective:  Taken off of isolation, verified by IP   Objective   Blood pressure 110/75, pulse 71, temperature 98.3  F (36.8 C), temperature source Oral, resp. rate 20, height 6' (1.829 m), weight 110.2 kg, SpO2 97 %.        Intake/Output Summary (Last 24 hours) at 01/12/2021 1016 Last data filed at 01/12/2021 0924 Gross per 24 hour  Intake 1610.88 ml  Output 2326 ml  Net -715.12 ml   Filed Weights   01/11/21 1617 01/12/21 0430  Weight: 112.1 kg 110.2 kg    Examination: General: Chronically older adult M, reclined in bed NAD  HENT: NCAT. Poor dentition. Anicteric sclera Lungs: CTAb Even and unlabored  Cardiovascular: rrr cap refill brisk  Abdomen: soft round ndnt  Extremities: no acute deformity, no cyanosis or clubbing.  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  -has some enlarged lymph notes on CT-- suspect reactive  P -dc contact/airborne precautions -needs OP pulm follow up & PFTs -IS  -PRN albuterol       Best practice (evaluated daily)  Diet: NPO for cath  Pain/Anxiety/Delirium protocol (if indicated): prn VAP protocol (if indicated): na DVT prophylaxis: heparin gtt GI prophylaxis: protonix Glucose control: monitor Mobility: per primary Disposition: cardiac tele  Goals of Care:  Last date of multidisciplinary goals of care discussion:-- Family and staff present: -- Summary of discussion:  Follow up goals of care discussion due: per primary Code Status: Full   Labs   CBC: Recent  Labs  Lab 01/11/21 2030 01/12/21 0433  WBC 7.9 8.4  NEUTROABS  --  5.3  HGB 12.9* 12.9*  HCT 37.4* 37.5*  MCV 93.5 93.5  PLT 211 267    Basic Metabolic Panel: Recent Labs  Lab 01/11/21 2030 01/12/21 0433  NA 132* 131*  K 4.5 4.4  CL 100 101  CO2 23 20*  GLUCOSE 114* 103*  BUN 22 20  CREATININE 1.61* 1.41*  CALCIUM 8.6* 8.7*   GFR: Estimated Creatinine Clearance: 64.3 mL/min (A) (by C-G formula based on SCr of 1.41 mg/dL (H)). Recent Labs  Lab 01/11/21 2030 01/12/21 0433  WBC 7.9 8.4    Liver Function Tests: Recent Labs  Lab 01/11/21 2030  AST 72*  ALT 37  ALKPHOS 63  BILITOT 1.0  PROT 5.9*  ALBUMIN 3.0*   No results for input(s): LIPASE, AMYLASE in the last 168 hours. No results for input(s): AMMONIA in the last 168 hours.  ABG No results found for: PHART, PCO2ART, PO2ART, HCO3, TCO2, ACIDBASEDEF, O2SAT   Coagulation Profile: No results for input(s): INR, PROTIME in the last 168 hours.  Cardiac Enzymes: No results for input(s): CKTOTAL, CKMB, CKMBINDEX, TROPONINI in the last 168 hours.  HbA1C: Hgb A1c MFr Bld  Date/Time Value Ref Range Status  01/11/2021 08:30 PM 5.3 4.8 - 5.6 % Final    Comment:    (NOTE) Pre diabetes:          5.7%-6.4%  Diabetes:              >6.4%  Glycemic control for   <7.0% adults with diabetes     CBG: No results for input(s): GLUCAP in the last 168 hours.  Review of Systems:   As per HPI   Past Medical History:  He,  has a past medical history of Arthritis, Cancer ([place]), CHF (congestive heart failure) ([place]), Chronic kidney disease, Diverticulosis, GERD (gastroesophageal reflux disease), and Hypertension.   Surgical History:   Past Surgical History:  Procedure Laterality Date  . COLONOSCOPY    . EYE SURGERY     age 56   . POLYPECTOMY       Social History:   reports that he quit smoking about 6 years ago. His smoking use included cigarettes. He has a 67.50 pack-year smoking history. He has quit using  smokeless tobacco. He reports previous alcohol use of about 12.0 standard drinks of alcohol per week. He reports that he does not use drugs.   Family History:  His family history includes CAD in his father; Congestive Heart Failure in his father. There is no history of Colon cancer, Colon polyps, Esophageal cancer, Rectal cancer, or Stomach cancer.   Allergies Allergies  Allergen Reactions  . Penicillins Rash  . Sulfa Antibiotics Rash     Home Medications  Prior to Admission medications   Medication Sig Start Date End Date Taking? Authorizing Provider  albuterol (VENTOLIN HFA) 108 (90 Base) MCG/ACT inhaler Inhale 2 puffs into the lungs every 4 (four) hours as needed for wheezing or shortness of breath.   Yes [provider]  carvedilol (COREG)  6.25 MG tablet Take 6.25 mg by mouth 2 (two) times daily. 06/14/20  Yes [provider]  ciprofloxacin (CIPRO) 500 MG tablet Take 500 mg by mouth 2 (two) times daily. 01/04/21  Yes [provider]  Cyanocobalamin (VITAMIN B 12) 500 MCG TABS Take by mouth as needed. Pt takes 1-2 x a month   Yes [provider]  doxazosin (CARDURA) 2 MG tablet Take by mouth. 06/14/20  Yes [provider]  furosemide (LASIX) 20 MG tablet Take 20 mg by mouth 3 (three) times daily. 06/14/20  Yes [provider]  HYDROcodone-acetaminophen (NORCO) 10-325 MG tablet Take 1 tablet by mouth every 4 (four) hours as needed for moderate pain. 06/14/20  Yes [provider]  lisinopril (ZESTRIL) 20 MG tablet Take 20 mg by mouth daily. 02/28/20  Yes [provider]  methocarbamol (ROBAXIN) 500 MG tablet Take 500 mg by mouth every 6 (six) hours as needed for muscle spasms.   Yes [provider]  omeprazole (PRILOSEC) 20 MG capsule Take 20 mg by mouth as needed.  06/14/20  Yes [provider]  predniSONE (DELTASONE) 10 MG tablet Take 10 mg by mouth See admin instructions. Take 6 tablets by mouth on day 1, then  decrease by one tablet daily until all gone   Yes [provider]  Vitamin D, Ergocalciferol, (DRISDOL) 1.25 MG (50000 UNIT) CAPS capsule Take 50,000 Units by mouth once a week. 06/14/20  Yes [provider]      Eliseo Gum MSN, AGACNP-BC [place] [phone] If no answer, [phone] 01/12/2021, 11:30 AM

## 2021-01-12 NOTE — Telephone Encounter (Signed)
Needs hospital follow up 

## 2021-01-12 NOTE — Progress Notes (Signed)
Darrell Simpson for heparin  Indication: chest pain/ACS  Allergies  Allergen Reactions  . Penicillins Rash  . Sulfa Antibiotics Rash    Patient Measurements: Height: 6' (182.9 cm) Weight: 110.2 kg (242 lb 14.4 oz) IBW/kg (Calculated) : 77.6 HEPARIN DW (KG): 101.5   Vital Signs: Temp: 98.2 F (36.8 C) (03/04 1701) Temp Source: Oral (03/04 1701) BP: 121/73 (03/04 1701) Pulse Rate: 76 (03/04 1701)  Labs: Recent Labs    01/11/21 1706 01/11/21 2030 01/12/21 0433  HGB  --  12.9* 12.9*  HCT  --  37.4* 37.5*  PLT  --  211 198  HEPARINUNFRC  --  0.24* 0.29*  CREATININE  --  1.61* 1.41*  TROPONINIHS 12,486*  --   --     Estimated Creatinine Clearance: 64.3 mL/min (A) (by C-G formula based on SCr of 1.41 mg/dL (H)).   Assessment: 70 yo male from [place] with CP. Pharmacy consulted to dose heparin. He is now s/p cath w/ multivessel CAD for consideration for CABG. Sheath removed ~ 4:30pm -last heparin rate was 1800 units/hr    Goal of Therapy:  Heparin level 0.3-0.7 units/ml Monitor platelets by anticoagulation protocol: Yes  Plan  -Restart heparin 1800 units/hr at 10:30pm -Heparin level in 6 hours and daily wth CBC daily  Hildred Laser, PharmD Clinical Pharmacist **Pharmacist phone directory can now be found on [email or web address] ([email or web address]).  Listed under [place].

## 2021-01-12 NOTE — Progress Notes (Signed)
  Echocardiogram 2D Echocardiogram has been performed with [facility].  Darrell Simpson 01/12/2021, 10:27 AM

## 2021-01-12 NOTE — Progress Notes (Signed)
[place] for heparin  Indication: chest pain/ACS  Allergies  Allergen Reactions  . Penicillins Rash  . Sulfa Antibiotics Rash    Patient Measurements: Height: 6' (182.9 cm) Weight: 110.2 kg (242 lb 14.4 oz) IBW/kg (Calculated) : 77.6 HEPARIN DW (KG): 101.5   Vital Signs: Temp: 98 F (36.7 C) (03/04 0430) Temp Source: Oral (03/04 0430) BP: 100/63 (03/04 0430) Pulse Rate: 72 (03/04 0430)  Labs: Recent Labs    01/11/21 1706 01/11/21 2030 01/12/21 0433  HGB  --  12.9* 12.9*  HCT  --  37.4* 37.5*  PLT  --  211 198  HEPARINUNFRC  --  0.24* 0.29*  CREATININE  --  1.61* 1.41*  TROPONINIHS 12,486*  --   --     Estimated Creatinine Clearance: 64.3 mL/min (A) (by C-G formula based on SCr of 1.41 mg/dL (H)).   Assessment: 70 yo male from [place] with CP. Pharmacy consulted to dose heparin.   Heparin level slightly subtherapeutic (0.29) on gtt at 1700 units/hr. No issues with line or bleeding reported per RN.  Goal of Therapy:  Heparin level 0.3-0.7 units/ml Monitor platelets by anticoagulation protocol: Yes  Plan  Increase heparin to 1800 units/hr F/u post cath  Sherlon Handing, PharmD, BCPS Please see amion for complete clinical pharmacist phone list 01/12/2021 5:47 AM

## 2021-01-12 NOTE — Plan of Care (Signed)
  Problem: Skin Integrity: Goal: Risk for impaired skin integrity will decrease Outcome: Completed/Met   Problem: Safety: Goal: Ability to remain free from injury will improve Outcome: Completed/Met   Problem: Pain Managment: Goal: General experience of comfort will improve Outcome: Completed/Met   Problem: Coping: Goal: Level of anxiety will decrease Outcome: Completed/Met   Problem: Nutrition: Goal: Adequate nutrition will be maintained Outcome: Completed/Met

## 2021-01-12 NOTE — Progress Notes (Signed)
Pt's COVID test came back positive, but no S/S, pt did just have COVID in Jan this year, on call MD notified, will continue to monitor, Thanks Arvella Nigh RN.

## 2021-01-12 NOTE — Interval H&P Note (Signed)
History and Physical Interval Note:  01/12/2021 3:03 PM  Darrell Simpson  has presented today for surgery, with the diagnosis of chest pain.  The various methods of treatment have been discussed with the patient and family. After consideration of risks, benefits and other options for treatment, the patient has consented to  Procedure(s): LEFT HEART CATH AND CORONARY ANGIOGRAPHY (N/A) as a surgical intervention.  The patient's history has been reviewed, patient examined, no change in status, stable for surgery.  I have reviewed the patient's chart and labs.  Questions were answered to the patient's satisfaction.    2016 Appropriate Use Criteria for Coronary Revascularization in Patients With Acute Coronary Syndrome NSTEMI/UA High Risk (TIMI Score 5-7) NSTEMI/Unstable angina, stabilized patient at high risk Link Here: [facility] Indication:  Revascularization by PCI or CABG of 1 or more arteries in a patient with NSTEMI or unstable angina with Stabilization after presentation High risk for clinical events  A (7) Indication: 16; Score 7   [place]

## 2021-01-12 NOTE — Progress Notes (Signed)
Per MD, Pt had COVID back in 11/2020. Antigen test was negative at [facility] on 01/10/2021. PCR is still positive here. Confirmed with infection prevention. He does not need to be in isolation.

## 2021-01-13 ENCOUNTER — Inpatient Hospital Stay (HOSPITAL_COMMUNITY): Payer: Medicare Other

## 2021-01-13 DIAGNOSIS — I214 Non-ST elevation (NSTEMI) myocardial infarction: Secondary | ICD-10-CM

## 2021-01-13 DIAGNOSIS — I25110 Atherosclerotic heart disease of native coronary artery with unstable angina pectoris: Secondary | ICD-10-CM

## 2021-01-13 LAB — BASIC METABOLIC PANEL
Anion gap: 10 (ref 5–15)
BUN: 22 mg/dL (ref 8–23)
CO2: 22 mmol/L (ref 22–32)
Calcium: 8.6 mg/dL — ABNORMAL LOW (ref 8.9–10.3)
Chloride: 99 mmol/L (ref 98–111)
Creatinine, Ser: 2 mg/dL — ABNORMAL HIGH (ref 0.61–1.24)
GFR, Estimated: 36 mL/min — ABNORMAL LOW (ref >60)
Glucose, Bld: 93 mg/dL (ref 70–99)
Potassium: 4.2 mmol/L (ref 3.5–5.1)
Sodium: 131 mmol/L — ABNORMAL LOW (ref 135–145)

## 2021-01-13 LAB — CBC WITH DIFFERENTIAL/PLATELET
Abs Immature Granulocytes: 0.05 10*3/uL (ref 0.00–0.07)
Basophils Absolute: 0 10*3/uL (ref 0.0–0.1)
Basophils Relative: 1 %
Eosinophils Absolute: 0.2 10*3/uL (ref 0.0–0.5)
Eosinophils Relative: 3 %
HCT: 35.9 % — ABNORMAL LOW (ref 39.0–52.0)
Hemoglobin: 13 g/dL (ref 13.0–17.0)
Immature Granulocytes: 1 %
Lymphocytes Relative: 30 %
Lymphs Abs: 2 10*3/uL (ref 0.7–4.0)
MCH: 33.3 pg (ref 26.0–34.0)
MCHC: 36.2 g/dL — ABNORMAL HIGH (ref 30.0–36.0)
MCV: 92.1 fL (ref 80.0–100.0)
Monocytes Absolute: 0.6 10*3/uL (ref 0.1–1.0)
Monocytes Relative: 9 %
Neutro Abs: 3.8 10*3/uL (ref 1.7–7.7)
Neutrophils Relative %: 56 %
Platelets: 176 10*3/uL (ref 150–400)
RBC: 3.9 MIL/uL — ABNORMAL LOW (ref 4.22–5.81)
RDW: 13.3 % (ref 11.5–15.5)
WBC: 6.7 10*3/uL (ref 4.0–10.5)
nRBC: 0 % (ref 0.0–0.2)

## 2021-01-13 LAB — HEPARIN LEVEL (UNFRACTIONATED): Heparin Unfractionated: 0.29 IU/mL — ABNORMAL LOW (ref 0.30–0.70)

## 2021-01-13 LAB — BRAIN NATRIURETIC PEPTIDE: B Natriuretic Peptide: 237.1 pg/mL — ABNORMAL HIGH (ref 0.0–100.0)

## 2021-01-13 MED ORDER — RANOLAZINE ER 500 MG PO TB12
1000.0000 mg | ORAL_TABLET | Freq: Two times a day (BID) | ORAL | Status: DC
  Administered 2021-01-13 – 2021-01-16 (×6): 1000 mg via ORAL
  Filled 2021-01-13 (×8): qty 2

## 2021-01-13 MED ORDER — ATORVASTATIN CALCIUM 80 MG PO TABS
80.0000 mg | ORAL_TABLET | Freq: Every day | ORAL | Status: DC
  Administered 2021-01-13 – 2021-01-16 (×4): 80 mg via ORAL
  Filled 2021-01-13 (×2): qty 1
  Filled 2021-01-13: qty 2
  Filled 2021-01-13: qty 1

## 2021-01-13 MED ORDER — HEPARIN SODIUM (PORCINE) 5000 UNIT/ML IJ SOLN
5000.0000 [IU] | Freq: Three times a day (TID) | INTRAMUSCULAR | Status: DC
  Administered 2021-01-13 – 2021-01-16 (×7): 5000 [IU] via SUBCUTANEOUS
  Filled 2021-01-13 (×7): qty 1

## 2021-01-13 MED ORDER — SODIUM CHLORIDE 0.9 % IV BOLUS
150.0000 mL | Freq: Once | INTRAVENOUS | Status: AC
  Administered 2021-01-13: 150 mL via INTRAVENOUS

## 2021-01-13 MED ORDER — TICAGRELOR 90 MG PO TABS
90.0000 mg | ORAL_TABLET | Freq: Two times a day (BID) | ORAL | Status: DC
  Administered 2021-01-13 – 2021-01-16 (×7): 90 mg via ORAL
  Filled 2021-01-13 (×8): qty 1

## 2021-01-13 MED ORDER — ISOSORBIDE DINITRATE 30 MG PO TABS
30.0000 mg | ORAL_TABLET | Freq: Two times a day (BID) | ORAL | Status: DC
  Administered 2021-01-13 – 2021-01-15 (×4): 30 mg via ORAL
  Filled 2021-01-13 (×6): qty 1

## 2021-01-13 MED ORDER — CARVEDILOL 3.125 MG PO TABS
3.1250 mg | ORAL_TABLET | Freq: Two times a day (BID) | ORAL | Status: DC
  Administered 2021-01-13 – 2021-01-16 (×6): 3.125 mg via ORAL
  Filled 2021-01-13 (×4): qty 1

## 2021-01-13 NOTE — Progress Notes (Signed)
Patient's blood pressure is 72/41.  Paged on-call doctor. No response at this time.  Will continue to monitor.   Rico Sheehan, RN

## 2021-01-13 NOTE — Progress Notes (Signed)
[place] for heparin  Indication: chest pain/ACS  Allergies  Allergen Reactions  . Penicillins Rash  . Sulfa Antibiotics Rash    Patient Measurements: Height: 6' (182.9 cm) Weight: 111.9 kg (246 lb 11.1 oz) IBW/kg (Calculated) : 77.6 HEPARIN DW (KG): 101.5   Vital Signs: Temp: 98 F (36.7 C) (03/05 0410) Temp Source: Oral (03/05 0410) BP: 80/50 (03/05 0410) Pulse Rate: 56 (03/05 0410)  Labs: Recent Labs    01/11/21 1706 01/11/21 2030 01/11/21 2030 01/12/21 0433 01/13/21 0524  HGB  --  12.9*   < > 12.9* 13.0  HCT  --  37.4*  --  37.5* 35.9*  PLT  --  211  --  198 176  HEPARINUNFRC  --  0.24*  --  0.29* 0.29*  CREATININE  --  1.61*  --  1.41*  --   TROPONINIHS 12,486*  --   --   --   --    < > = values in this interval not displayed.    Estimated Creatinine Clearance: 64.8 mL/min (A) (by C-G formula based on SCr of 1.41 mg/dL (H)).   Assessment: 70 yo male from [place] with CP. Pharmacy consulted to dose heparin. He is now s/p cath w/ multivessel CAD for consideration for CABG. Sheath removed ~ 4:30pm -last heparin rate was 1800 units/hr  Heparin level slightly subtherapeutic (0.29) on gtt at 1800 units/hr. No issues with line or bleeding reported per RN.  Goal of Therapy:  Heparin level 0.3-0.7 units/ml Monitor platelets by anticoagulation protocol: Yes  Plan  Increase heparin to 1900 units/hr F/u 6 hr heparin level  Sherlon Handing, PharmD, BCPS Please see amion for complete clinical pharmacist phone list 01/13/2021 6:14 AM

## 2021-01-13 NOTE — Consult Note (Signed)
Reason for Consult:3 vessel CAD s/p non-STEMI Referring Physician: Dr. [name] is an 70 y.o. male.  HPI: 70 yo man presented with CP  Darrell Simpson is a 70 yo man with a history of hypertension, tobacco abuse (quit 2016), reflux, psoriatic arthritis and stage III CKD. No prior history of CAD. He has been having issues with shortness of breath with exertion for about 3 years. Recently would walk about a minute then have to rest for 3 minutes. Had COVID 19 in January. Was pretty much in bed for 3 weeks. Did not get treated. Has not been active since then.  On 3/2 he developed sudden onset of substernal CP radiating to necc, jaw, and left arm. Went to [facility] ED. Elevated troponin, r/i for NSTEMI. Had CT angiogram which did not show PE but did show significant lung disease.  Transferred to [facility]. Cath yesterday showed 50% left main, CTO of LAD, 80% proximal stenosis in dominant circumflex and 90% in nondominant RCA. EF 40% by Echo.  Past Medical History:  Diagnosis Date  . Arthritis    psioriatic arthritis   . Cancer ([place])    squamous cell skin cancer right cheek   . CHF (congestive heart failure) ([place])   . Chronic kidney disease    back to 80% func- improving per pt   . Diverticulosis   . GERD (gastroesophageal reflux disease)   . Hypertension     Past Surgical History:  Procedure Laterality Date  . COLONOSCOPY    . EYE SURGERY     age 73   . POLYPECTOMY      Family History  Problem Relation Age of Onset  . CAD Father   . Congestive Heart Failure Father   . Colon cancer Neg Hx   . Colon polyps Neg Hx   . Esophageal cancer Neg Hx   . Rectal cancer Neg Hx   . Stomach cancer Neg Hx     Social History:  reports that he quit smoking about 6 years ago. His smoking use included cigarettes. He has a 67.50 pack-year smoking history. He has quit using smokeless tobacco. He reports previous alcohol use of about 12.0 standard drinks of alcohol per week. He reports that he  does not use drugs.  Allergies:  Allergies  Allergen Reactions  . Penicillins Rash  . Sulfa Antibiotics Rash    Medications:  Scheduled: . aspirin EC  81 mg Oral Daily  . atorvastatin  80 mg Oral Daily  . carvedilol  3.125 mg Oral BID  . doxazosin  2 mg Oral Daily  . furosemide  40 mg Intravenous BID  . heparin  5,000 Units Subcutaneous Q8H  . isosorbide dinitrate  30 mg Oral BID  . pantoprazole  40 mg Oral Daily  . ranolazine  1,000 mg Oral BID  . sodium chloride flush  3 mL Intravenous Q12H    Results for orders placed or performed during the hospital encounter of 01/11/21 (from the past 48 hour(s))  HIV Antibody (routine testing w rflx)     Status: None   Collection Time: 01/11/21  5:06 PM  Result Value Ref Range   HIV Screen 4th Generation wRfx Non Reactive Non Reactive    Comment: Performed at [facility], [place] [address] [place], [place] [address]  Troponin I (High Sensitivity)     Status: Abnormal   Collection Time: 01/11/21  5:06 PM  Result Value Ref Range   Troponin I (High Sensitivity) 12,486 (  HH) <18 ng/L    Comment: CRITICAL RESULT CALLED TO, READ BACK BY AND VERIFIED WITH: Penalosa RN [id number] 01/11/21 MCCORMICK K (NOTE) Elevated high sensitivity troponin I (hsTnI) values and significant  changes across serial measurements may suggest ACS but many other  chronic and acute conditions are known to elevate hsTnI results.  Refer to the Links section for chest pain algorithms and additional  guidance. Performed at [facility], [place] [address] [place], [place] [address]   Heparin level (unfractionated)     Status: Abnormal   Collection Time: 01/11/21  8:30 PM  Result Value Ref Range   Heparin Unfractionated 0.24 (L) 0.30 - 0.70 IU/mL    Comment: (NOTE) If heparin results are below expected values, and patient dosage has  been confirmed, suggest follow up testing of antithrombin III levels. Performed at [facility], [place] [address],  [place], [place] [address]   CBC     Status: Abnormal   Collection Time: 01/11/21  8:30 PM  Result Value Ref Range   WBC 7.9 4.0 - 10.5 K/uL   RBC 4.00 (L) 4.22 - 5.81 MIL/uL   Hemoglobin 12.9 (L) 13.0 - 17.0 g/dL   HCT 37.4 (L) 39.0 - 52.0 %   MCV 93.5 80.0 - 100.0 fL   MCH 32.3 26.0 - 34.0 pg   MCHC 34.5 30.0 - 36.0 g/dL   RDW 13.2 11.5 - 15.5 %   Platelets 211 150 - 400 K/uL   nRBC 0.0 0.0 - 0.2 %    Comment: Performed at [facility], [place] [address] [place], [place] [address]  Comprehensive metabolic panel     Status: Abnormal   Collection Time: 01/11/21  8:30 PM  Result Value Ref Range   Sodium 132 (L) 135 - 145 mmol/L   Potassium 4.5 3.5 - 5.1 mmol/L   Chloride 100 98 - 111 mmol/L   CO2 23 22 - 32 mmol/L   Glucose, Bld 114 (H) 70 - 99 mg/dL    Comment: Glucose reference range applies only to samples taken after fasting for at least 8 hours.   BUN 22 8 - 23 mg/dL   Creatinine, Ser 1.61 (H) 0.61 - 1.24 mg/dL   Calcium 8.6 (L) 8.9 - 10.3 mg/dL   Total Protein 5.9 (L) 6.5 - 8.1 g/dL   Albumin 3.0 (L) 3.5 - 5.0 g/dL   AST 72 (H) 15 - 41 U/L   ALT 37 0 - 44 U/L   Alkaline Phosphatase 63 38 - 126 U/L   Total Bilirubin 1.0 0.3 - 1.2 mg/dL   GFR, Estimated 46 (L) >60 mL/min    Comment: (NOTE) Calculated using the CKD-EPI Creatinine Equation (2021)    Anion gap 9 5 - 15    Comment: Performed at [facility], [place] [address] [place], [place] [address]  Hemoglobin A1c     Status: None   Collection Time: 01/11/21  8:30 PM  Result Value Ref Range   Hgb A1c MFr Bld 5.3 4.8 - 5.6 %    Comment: (NOTE) Pre diabetes:          5.7%-6.4%  Diabetes:              >6.4%  Glycemic control for   <7.0% adults with diabetes    Mean Plasma Glucose 105.41 mg/dL    Comment: Performed at [facility] [address], [place], [place] [address]  Lipid panel     Status:  Abnormal   Collection Time: 01/11/21  8:30 PM  Result Value Ref Range   Cholesterol 179 0 - 200 mg/dL    Triglycerides 107 <150 mg/dL   HDL 49 >40 mg/dL   Total CHOL/HDL Ratio 3.7 RATIO   VLDL 21 0 - 40 mg/dL   LDL Cholesterol 109 (H) 0 - 99 mg/dL    Comment:        Total Cholesterol/HDL:CHD Risk Coronary Heart Disease Risk Table                     Men   Women  1/2 Average Risk   3.4   3.3  Average Risk       5.0   4.4  2 X Average Risk   9.6   7.1  3 X Average Risk  23.4   11.0        Use the calculated Patient Ratio above and the CHD Risk Table to determine the patient's CHD Risk.        ATP III CLASSIFICATION (LDL):  <100     mg/dL   Optimal  100-129  mg/dL   Near or Above                    Optimal  130-159  mg/dL   Borderline  160-189  mg/dL   High  >190     mg/dL   Very High Performed at [facility] [address], [place], [place] [address]   Brain natriuretic peptide     Status: Abnormal   Collection Time: 01/11/21  8:31 PM  Result Value Ref Range   B Natriuretic Peptide 491.7 (H) 0.0 - 100.0 pg/mL    Comment: Performed at [facility] [address] [place], [place] [address]  SARS CORONAVIRUS 2 (TAT 6-24 HRS) Nasopharyngeal Nasopharyngeal Swab     Status: Abnormal   Collection Time: 01/11/21  9:05 PM   Specimen: Nasopharyngeal Swab  Result Value Ref Range   SARS Coronavirus 2 POSITIVE (A) NEGATIVE    Comment: (NOTE) SARS-CoV-2 target nucleic acids are DETECTED.  The SARS-CoV-2 RNA is generally detectable in upper and lower respiratory specimens during the acute phase of infection. Positive results are indicative of the presence of SARS-CoV-2 RNA. Clinical correlation with patient history and other diagnostic information is  necessary to determine patient infection status. Positive results do not rule out bacterial infection or co-infection with other viruses.  The expected result is Negative.  Fact Sheet for Patients: SugarRoll.be  Fact Sheet for Healthcare  Providers: https://www.woods-mathews.com/  This test is not yet approved or cleared by the [place] FDA and  has been authorized for detection and/or diagnosis of SARS-CoV-2 by FDA under an Emergency Use Authorization (EUA). This EUA will remain  in effect (meaning this test can be used) for the duration of the COVID-19 declaration under Section 564(b)(1) of the Act, 21 U. S.C. section 360bbb-3(b)(1), unless the authorization is terminated or revoked sooner.   Performed at [facility], [place] [address], [place], [place] [address]   Basic metabolic panel     Status: Abnormal   Collection Time: 01/12/21  4:33 AM  Result Value Ref Range   Sodium 131 (L) 135 - 145 mmol/L   Potassium 4.4 3.5 - 5.1 mmol/L   Chloride 101 98 - 111 mmol/L   CO2 20 (L) 22 - 32 mmol/L   Glucose, Bld 103 (H) 70 - 99 mg/dL  Comment: Glucose reference range applies only to samples taken after fasting for at least 8 hours.   BUN 20 8 - 23 mg/dL   Creatinine, Ser 1.41 (H) 0.61 - 1.24 mg/dL   Calcium 8.7 (L) 8.9 - 10.3 mg/dL   GFR, Estimated 54 (L) >60 mL/min    Comment: (NOTE) Calculated using the CKD-EPI Creatinine Equation (2021)    Anion gap 10 5 - 15    Comment: Performed at [facility] [address], [place], [place] [address]  CBC WITH DIFFERENTIAL     Status: Abnormal   Collection Time: 01/12/21  4:33 AM  Result Value Ref Range   WBC 8.4 4.0 - 10.5 K/uL   RBC 4.01 (L) 4.22 - 5.81 MIL/uL   Hemoglobin 12.9 (L) 13.0 - 17.0 g/dL   HCT 37.5 (L) 39.0 - 52.0 %   MCV 93.5 80.0 - 100.0 fL   MCH 32.2 26.0 - 34.0 pg   MCHC 34.4 30.0 - 36.0 g/dL   RDW 13.4 11.5 - 15.5 %   Platelets 198 150 - 400 K/uL   nRBC 0.0 0.0 - 0.2 %   Neutrophils Relative % 62 %   Neutro Abs 5.3 1.7 - 7.7 K/uL   Lymphocytes Relative 25 %   Lymphs Abs 2.1 0.7 - 4.0 K/uL   Monocytes Relative 9 %   Monocytes Absolute 0.8 0.1 - 1.0 K/uL   Eosinophils Relative 2 %   Eosinophils Absolute 0.2 0.0 - 0.5  K/uL   Basophils Relative 1 %   Basophils Absolute 0.1 0.0 - 0.1 K/uL   Immature Granulocytes 1 %   Abs Immature Granulocytes 0.04 0.00 - 0.07 K/uL    Comment: Performed at [facility], [address] [place], [place] [address]  Heparin level (unfractionated)     Status: Abnormal   Collection Time: 01/12/21  4:33 AM  Result Value Ref Range   Heparin Unfractionated 0.29 (L) 0.30 - 0.70 IU/mL    Comment: (NOTE) If heparin results are below expected values, and patient dosage has  been confirmed, suggest follow up testing of antithrombin III levels. Performed at [facility], [place] [address], [place], [place] [address]   Basic metabolic panel     Status: Abnormal   Collection Time: 01/13/21  5:24 AM  Result Value Ref Range   Sodium 131 (L) 135 - 145 mmol/L   Potassium 4.2 3.5 - 5.1 mmol/L   Chloride 99 98 - 111 mmol/L   CO2 22 22 - 32 mmol/L   Glucose, Bld 93 70 - 99 mg/dL    Comment: Glucose reference range applies only to samples taken after fasting for at least 8 hours.   BUN 22 8 - 23 mg/dL   Creatinine, Ser 2.00 (H) 0.61 - 1.24 mg/dL   Calcium 8.6 (L) 8.9 - 10.3 mg/dL   GFR, Estimated 36 (L) >60 mL/min    Comment: (NOTE) Calculated using the CKD-EPI Creatinine Equation (2021)    Anion gap 10 5 - 15    Comment: Performed at [facility] [address], [place], [place] [address]  CBC WITH DIFFERENTIAL     Status: Abnormal   Collection Time: 01/13/21  5:24 AM  Result Value Ref Range   WBC 6.7 4.0 - 10.5 K/uL   RBC 3.90 (L) 4.22 - 5.81 MIL/uL   Hemoglobin 13.0 13.0 - 17.0 g/dL   HCT 35.9 (L) 39.0 - 52.0 %   MCV 92.1 80.0 - 100.0  fL   MCH 33.3 26.0 - 34.0 pg   MCHC 36.2 (H) 30.0 - 36.0 g/dL   RDW 13.3 11.5 - 15.5 %   Platelets 176 150 - 400 K/uL   nRBC 0.0 0.0 - 0.2 %   Neutrophils Relative % 56 %   Neutro Abs 3.8 1.7 - 7.7 K/uL   Lymphocytes Relative 30 %   Lymphs Abs 2.0 0.7 - 4.0 K/uL   Monocytes Relative 9 %   Monocytes Absolute 0.6 0.1 - 1.0  K/uL   Eosinophils Relative 3 %   Eosinophils Absolute 0.2 0.0 - 0.5 K/uL   Basophils Relative 1 %   Basophils Absolute 0.0 0.0 - 0.1 K/uL   Immature Granulocytes 1 %   Abs Immature Granulocytes 0.05 0.00 - 0.07 K/uL    Comment: Performed at [facility] [address], [place], [place] [address]  Heparin level (unfractionated)     Status: Abnormal   Collection Time: 01/13/21  5:24 AM  Result Value Ref Range   Heparin Unfractionated 0.29 (L) 0.30 - 0.70 IU/mL    Comment: (NOTE) If heparin results are below expected values, and patient dosage has  been confirmed, suggest follow up testing of antithrombin III levels. Performed at [facility], [place] [address] [place], [place] [address]     DG Chest 1 View  Result Date: 01/11/2021 CLINICAL DATA:  Chest pain EXAM: CHEST  1 VIEW COMPARISON:  January 10, 2021 FINDINGS: The heart size and mediastinal contours are mildly enlarged. There is prominence of the central pulmonary vasculature. Patchy airspace opacity is seen at the left lung base. Mildly increased interstitial markings seen at the right lung base. The visualized skeletal structures are unremarkable. IMPRESSION: Bilateral interstitial and patchy airspace opacities at the lung bases which could be due to multifocal pneumonia or chronic lung changes. Mild pulmonary vascular congestion. Electronically Signed   By: Prudencio Pair M.D.   On: 01/11/2021 19:31   CARDIAC CATHETERIZATION  Result Date: 01/12/2021 LM: Distal calcific 50% stenosis LAD: Prox CTO with weak left-to-left collaterals from LCx/OM LCx: Severe calcification throughout the vessel         Prox calcific 80% stenosis         OM1 Ostial 60%, followed by prox 90% stenosis RCA: Prox calcific 90% tubular stenosis         Mid 40% stenosis LVEDP 1 mmHg Severe calcific multivessel CAD Patient's IV NTG was held due to hypotension. However, it was resumed at 20 mcg/min due to chest pain. Chest pain improved. CT surgery consulted  for consideration for CABG Darrell Mormon, MD Pager: [phone] Office: [phone]   ECHOCARDIOGRAM COMPLETE  Result Date: 01/12/2021    ECHOCARDIOGRAM REPORT   Patient Name:   Darrell Simpson Date of Exam: 01/12/2021 Medical Rec #:  [id number]    Height:       72.0 in Accession #:    [id number]   Weight:       242.9 lb Date of Birth:  [birth year]     BSA:          2.314 m Patient Age:    2 years     BP:           92/55 mmHg Patient Gender: M            HR:           71 bpm. Exam Location:  Inpatient Procedure: 2D Echo, Cardiac Doppler, Color Doppler and Intracardiac  Opacification Agent Indications:     NSTEMI  History:         Patient has no prior history of Echocardiogram examinations.                  Risk Factors:Hypertension and Former Smoker.  Sonographer:     Clayton Lefort RDCS (AE) Referring Phys:  [id number] [facility] J PATWARDHAN Diagnosing Phys: Vernell Leep MD  Sonographer Comments: Technically difficult study due to poor echo windows and patient is morbidly obese. CKD. IMPRESSIONS  1. Left ventricular ejection fraction, by estimation, is 40 to 45%. The left ventricle has mildly decreased function. The left ventricle demonstrates regional wall motion abnormalities (see scoring diagram/findings for description). There is mild left ventricular hypertrophy. Left ventricular diastolic parameters are consistent with Grade I diastolic dysfunction (impaired relaxation).  2. Right ventricular systolic function was not well visualized. The right ventricular size is not well visualized.  3. The mitral valve is grossly normal. Mild mitral valve regurgitation.  4. The aortic valve is tricuspid. There is mild thickening of the aortic valve. Aortic valve regurgitation is not visualized. FINDINGS  Left Ventricle: Left ventricular ejection fraction, by estimation, is 40 to 45%. The left ventricle has mildly decreased function. The left ventricle demonstrates regional wall motion abnormalities. Definity  contrast agent was given IV to delineate the left ventricular endocardial borders. The left ventricular internal cavity size was small. There is mild left ventricular hypertrophy. Left ventricular diastolic parameters are consistent with Grade I diastolic dysfunction (impaired relaxation).  LV Wall Scoring: Distal LAD territory severe hypokinesis. Right Ventricle: The right ventricular size is not well visualized. Right vetricular wall thickness was not well visualized. Right ventricular systolic function was not well visualized. Left Atrium: Left atrial size was normal in size. Right Atrium: Right atrial size was normal in size. Pericardium: There is no evidence of pericardial effusion. Mitral Valve: The mitral valve is grossly normal. Mild mitral valve regurgitation. Tricuspid Valve: The tricuspid valve is not well visualized. Tricuspid valve regurgitation is trivial. Aortic Valve: The aortic valve is tricuspid. There is mild thickening of the aortic valve. Aortic valve regurgitation is not visualized. Aortic valve mean gradient measures 4.0 mmHg. Aortic valve peak gradient measures 7.3 mmHg. Aortic valve area, by VTI  measures 2.60 cm. Pulmonic Valve: The pulmonic valve was grossly normal. Pulmonic valve regurgitation is not visualized. Aorta: The aortic root and ascending aorta are structurally normal, with no evidence of dilitation. IAS/Shunts: No atrial level shunt detected by color flow Doppler.  LEFT VENTRICLE PLAX 2D LVIDd:         5.30 cm  Diastology LVIDs:         3.60 cm  LV e' medial:    4.05 cm/s LV PW:         1.50 cm  LV E/e' medial:  10.9 LV IVS:        1.90 cm  LV e' lateral:   5.92 cm/s LVOT diam:     2.00 cm  LV E/e' lateral: 7.4 LV SV:         56 LV SV Index:   24 LVOT Area:     3.14 cm  IVC IVC diam: 2.00 cm LEFT ATRIUM             Index LA diam:        4.30 cm 1.86 cm/m LA Vol (A2C):   37.7 ml 16.29 ml/m LA Vol (A4C):   67.3 ml 29.08 ml/m LA Biplane Vol: 52.6 ml  22.73 ml/m  AORTIC VALVE  AV Area (Vmax):    2.35 cm AV Area (Vmean):   2.41 cm AV Area (VTI):     2.60 cm AV Vmax:           135.00 cm/s AV Vmean:          94.600 cm/s AV VTI:            0.215 m AV Peak Grad:      7.3 mmHg AV Mean Grad:      4.0 mmHg LVOT Vmax:         101.00 cm/s LVOT Vmean:        72.600 cm/s LVOT VTI:          0.178 m LVOT/AV VTI ratio: 0.83  AORTA Ao Root diam: 3.50 cm Ao Asc diam:  3.70 cm MITRAL VALVE MV Area (PHT): 2.09 cm    SHUNTS MV Decel Time: 363 msec    Systemic VTI:  0.18 m MV E velocity: 44.10 cm/s  Systemic Diam: 2.00 cm MV A velocity: 78.30 cm/s MV E/A ratio:  0.56 Manish Patwardhan MD Electronically signed by Vernell Leep MD Signature Date/Time: 01/12/2021/3:22:27 PM    Final    I personally reviewed the cath images as well as the CT chest images. Findings as noted above for cath. 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 + adenopathy  Review of Systems  Respiratory: Positive for shortness of breath (3 years).   Cardiovascular: Positive for chest pain. Negative for leg swelling.  Gastrointestinal: Positive for abdominal pain (reflux).  Musculoskeletal: Positive for arthralgias and joint swelling.   Blood pressure (!) 96/51, pulse (!) 55, temperature 98.1 F (36.7 C), temperature source Oral, resp. rate 16, height 6' (1.829 m), weight 111.9 kg, SpO2 95 %. Physical Exam Vitals reviewed.  Constitutional:      Appearance: He is obese.  Eyes:     General: No scleral icterus.    Extraocular Movements: Extraocular movements intact.  Neck:     Vascular: Carotid bruit (high pitched on left, lower pitch on R) present.  Cardiovascular:     Rate and Rhythm: Normal rate and regular rhythm.     Heart sounds: No murmur heard.   Pulmonary:     Effort: Pulmonary effort is normal.     Breath sounds: Rales present.  Abdominal:     General: There is no distension.     Palpations: Abdomen is soft.  Musculoskeletal:        General: No swelling.   Neurological:     General: No focal deficit present.     Mental Status: He is alert and oriented to person, place, and time.     Cranial Nerves: No cranial nerve deficit.     Motor: No weakness.     Gait: Gait normal.     Assessment/Plan: Darrell Simpson is 70 yo man with a past history of hypertension, tobacco abuse (quit 2016), reflux, psoriatic arthritis and stage III CKD. He presented with CP and r/i for a non-STEMI. Cath shows moderate LM and severe 3 vessel CAD. Moderately impaired LV function. LAD is chronically occluded and uncertain quality but appears to be a poor target.  He has significant underlying pulmonary disease with a 3 year history of worsening dyspnea with exertion. On top of that had recent COVID infection. 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  Also has AKI in  setting of CKD with creatinine up to 2.0  I have serious concerns about his ability to undergo CABG with poor targets in light of his pulmonary issues and AKI. Also not a good candidate for PCI in setting of AKI.  D/w Dr. Einar Gip. Agree with his plan to start [place] and manage medically. Can then have pulmonary issues addressed outside of acute setting. PCI may be a better option given the severity of his pulmonary issues.   Melrose Nakayama 01/13/2021, 12:33 PM

## 2021-01-13 NOTE — Progress Notes (Signed)
Dr. Einar Gip returned call and requested a Normal Saline bolus be given at 150cc.  Will continue to monitor.   Donah Driver, RN

## 2021-01-13 NOTE — Progress Notes (Addendum)
Subjective:  Patient had low blood pressure was asymptomatic from this.  He has not had any recurrence of chest pain.  He is concerned about ongoing dyspnea for the past 3 to 4 years, has been having chest pain for the past 3 to 4 years and only worse lately and he presented with NSTEMI.  Intake/Output from previous day:  I/O last 3 completed shifts: In: 2000 [P.O.:1320; I.V.:680] Out: 2637 [Urine:5125; Stool:1] No intake/output data recorded.  Blood pressure (!) 96/51, pulse (!) 55, temperature 98.1 F (36.7 C), temperature source Oral, resp. rate 16, height 6' (1.829 m), weight 111.9 kg, SpO2 95 %. Physical Exam Constitutional:      Appearance: He is obese.  HENT:     Mouth/Throat:     Mouth: Mucous membranes are moist.  Eyes:     Extraocular Movements: Extraocular movements intact.  Cardiovascular:     Rate and Rhythm: Normal rate and regular rhythm.     Pulses: Intact distal pulses.          Carotid pulses are 2+ on the right side and 2+ on the left side.      Dorsalis pedis pulses are 2+ on the right side and 2+ on the left side.       Posterior tibial pulses are 2+ on the right side and 2+ on the left side.     Heart sounds: Heart sounds are distant. No murmur heard. No gallop.      Comments: No leg edema, no JVD. Pulmonary:     Effort: Pulmonary effort is normal.     Breath sounds: Rales (Bilateral basal coarse) present.  Abdominal:     General: Bowel sounds are normal.     Palpations: Abdomen is soft.  Musculoskeletal:     Cervical back: Normal range of motion.  Skin:    General: Skin is warm and dry.  Neurological:     General: No focal deficit present.     Mental Status: He is alert and oriented to person, place, and time.  Psychiatric:        Mood and Affect: Mood normal.     Lab Results: BNP (last 3 results) Recent Labs    01/11/21 2031  BNP 491.7*    ProBNP (last 3 results) No results for input(s): PROBNP in the last 8760 hours. BMP Latest Ref Rng  & Units 01/13/2021 01/12/2021 01/11/2021  Glucose 70 - 99 mg/dL 93 103(H) 114(H)  BUN 8 - 23 mg/dL '22 20 22  ' Creatinine 0.61 - 1.24 mg/dL 2.00(H) 1.41(H) 1.61(H)  Sodium 135 - 145 mmol/L 131(L) 131(L) 132(L)  Potassium 3.5 - 5.1 mmol/L 4.2 4.4 4.5  Chloride 98 - 111 mmol/L 99 101 100  CO2 22 - 32 mmol/L 22 20(L) 23  Calcium 8.9 - 10.3 mg/dL 8.6(L) 8.7(L) 8.6(L)   Hepatic Function Latest Ref Rng & Units 01/11/2021  Total Protein 6.5 - 8.1 g/dL 5.9(L)  Albumin 3.5 - 5.0 g/dL 3.0(L)  AST 15 - 41 U/L 72(H)  ALT 0 - 44 U/L 37  Alk Phosphatase 38 - 126 U/L 63  Total Bilirubin 0.3 - 1.2 mg/dL 1.0   CBC Latest Ref Rng & Units 01/13/2021 01/12/2021 01/11/2021  WBC 4.0 - 10.5 K/uL 6.7 8.4 7.9  Hemoglobin 13.0 - 17.0 g/dL 13.0 12.9(L) 12.9(L)  Hematocrit 39.0 - 52.0 % 35.9(L) 37.5(L) 37.4(L)  Platelets 150 - 400 K/uL 176 198 211   Lipid Panel     Component Value Date/Time   CHOL 179 01/11/2021  2030   TRIG 107 01/11/2021 2030   HDL 49 01/11/2021 2030   CHOLHDL 3.7 01/11/2021 2030   VLDL 21 01/11/2021 2030   LDLCALC 109 (H) 01/11/2021 2030   Cardiac Panel (last 3 results) No results for input(s): CKTOTAL, CKMB, TROPONINI, RELINDX in the last 72 hours.  HEMOGLOBIN A1C Lab Results  Component Value Date   HGBA1C 5.3 01/11/2021   MPG 105.41 01/11/2021   TSH No results for input(s): TSH in the last 8760 hours. Imaging:  DG Chest 1 View  Result Date: 01/11/2021 CLINICAL DATA:  Chest pain EXAM: CHEST  1 VIEW COMPARISON:  January 10, 2021 FINDINGS: The heart size and mediastinal contours are mildly enlarged. There is prominence of the central pulmonary vasculature. Patchy airspace opacity is seen at the left lung base. Mildly increased interstitial markings seen at the right lung base. The visualized skeletal structures are unremarkable. IMPRESSION: Bilateral interstitial and patchy airspace opacities at the lung bases which could be due to multifocal pneumonia or chronic lung changes. Mild pulmonary  vascular congestion. Electronically Signed   By: Prudencio Pair M.D.   On: 01/11/2021 19:31   CTA Chest 3/2/202: No PE Patchy peripheral ground-glass opacities and subpleural reticulation, w/some associated architectural distortion b/l, suggesting persistent multifocal pneumonia superimposed on interstitial lung disease (UIP) and/or chronic post infectious change Numerous small and moderately enlarged lymph nodes throughout the mediastinum and b/l perihilar regions, likely reactive. Neoplastic process not excluded, but considered less likely Diffuse coronary artery calcifications   Cardiac Studies: EKG 01/11/3021: Sinus rhythm,  LAFB, Anterolateral infarct, age indeterminate  Echocardiogram 01/12/2021: 1. Left ventricular ejection fraction, by estimation, is 40 to 45%. The left ventricle has mildly decreased function. The left ventricle demonstrates regional wall motion abnormalities (see scoring diagram/findings for description). There is mild left  ventricular hypertrophy. Left ventricular diastolic parameters are consistent with Grade I diastolic dysfunction (impaired relaxation).  2. Right ventricular systolic function was not well visualized. The right ventricular size is not well visualized.  3. The mitral valve is grossly normal. Mild mitral valve regurgitation.  4. The aortic valve is tricuspid. There is mild thickening of the aortic valve. Aortic valve regurgitation is not visualized.   Scheduled Meds: . aspirin EC  81 mg Oral Daily  . carvedilol  3.125 mg Oral BID  . doxazosin  2 mg Oral Daily  . furosemide  40 mg Intravenous BID  . heparin  5,000 Units Subcutaneous Q8H  . isosorbide dinitrate  30 mg Oral BID  . pantoprazole  40 mg Oral Daily  . ranolazine  1,000 mg Oral BID  . sodium chloride flush  3 mL Intravenous Q12H   Continuous Infusions: . 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  1.  NSTEMI 2.  Multivessel coronary artery disease, extremely complex and calcified vessels. 3.  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  Acute systolic heart failure  5.  COPD with centrilobular emphysema and chronic dyspnea on exertion for the past 3 to 4 years and also recent Covid pneumonia contributing to his dyspnea. 6.  Tobacco use disorder 7.  Hyperlipidemia   Recommendation: Patient is in acute decompensated heart failure.  He is also  in acute renal failure related to hypotension and contrast exposure.  At this point I feel extremely guarded with poor long-term outcomes in view of low cardiac output, renal failure and underlying COPD places him at extreme high risk for periprocedural complications whether it is PCI or CABG.  I will discuss with the TCTS team before consideration of any other options to stabilize him medically.  We will discontinue IV heparin, I would like to start him on Brilinta if surgical team agrees.  Otherwise continue aspirin, subcu heparin, I placed him on low-dose beta-blocker, reduce the dose from 6.25 mg carvedilol to 3.125 mg twice daily in view of low blood pressure, discontinued his ACE inhibitor due to acute renal failure.  We will start the patient on Ranexa 1000 mg p.o. twice daily.  Continue IV furosemide and saline lock IV fluids.  I will repeat chest x-ray today and also check BNP and BMP daily including today.  Check TSH.  Start atorvastatin 80 mg daily.   Adrian Prows, MD, [facility] 01/13/2021, 11:49 AM Office: [phone] Pager: [phone]

## 2021-01-14 LAB — BASIC METABOLIC PANEL
Anion gap: 11 (ref 5–15)
BUN: 25 mg/dL — ABNORMAL HIGH (ref 8–23)
CO2: 20 mmol/L — ABNORMAL LOW (ref 22–32)
Calcium: 8.7 mg/dL — ABNORMAL LOW (ref 8.9–10.3)
Chloride: 98 mmol/L (ref 98–111)
Creatinine, Ser: 1.64 mg/dL — ABNORMAL HIGH (ref 0.61–1.24)
GFR, Estimated: 45 mL/min — ABNORMAL LOW (ref >60)
Glucose, Bld: 101 mg/dL — ABNORMAL HIGH (ref 70–99)
Potassium: 4 mmol/L (ref 3.5–5.1)
Sodium: 129 mmol/L — ABNORMAL LOW (ref 135–145)

## 2021-01-14 LAB — CBC WITH DIFFERENTIAL/PLATELET
Abs Immature Granulocytes: 0.06 10*3/uL (ref 0.00–0.07)
Basophils Absolute: 0 10*3/uL (ref 0.0–0.1)
Basophils Relative: 0 %
Eosinophils Absolute: 0.2 10*3/uL (ref 0.0–0.5)
Eosinophils Relative: 3 %
HCT: 34.5 % — ABNORMAL LOW (ref 39.0–52.0)
Hemoglobin: 12 g/dL — ABNORMAL LOW (ref 13.0–17.0)
Immature Granulocytes: 1 %
Lymphocytes Relative: 28 %
Lymphs Abs: 2.1 10*3/uL (ref 0.7–4.0)
MCH: 32.5 pg (ref 26.0–34.0)
MCHC: 34.8 g/dL (ref 30.0–36.0)
MCV: 93.5 fL (ref 80.0–100.0)
Monocytes Absolute: 0.6 10*3/uL (ref 0.1–1.0)
Monocytes Relative: 8 %
Neutro Abs: 4.6 10*3/uL (ref 1.7–7.7)
Neutrophils Relative %: 60 %
Platelets: 190 10*3/uL (ref 150–400)
RBC: 3.69 MIL/uL — ABNORMAL LOW (ref 4.22–5.81)
RDW: 13.2 % (ref 11.5–15.5)
WBC: 7.6 10*3/uL (ref 4.0–10.5)
nRBC: 0 % (ref 0.0–0.2)

## 2021-01-14 LAB — BRAIN NATRIURETIC PEPTIDE: B Natriuretic Peptide: 109.4 pg/mL — ABNORMAL HIGH (ref 0.0–100.0)

## 2021-01-14 MED ORDER — MOMETASONE FURO-FORMOTEROL FUM 100-5 MCG/ACT IN AERO
2.0000 | INHALATION_SPRAY | Freq: Two times a day (BID) | RESPIRATORY_TRACT | Status: DC
  Administered 2021-01-15 – 2021-01-16 (×3): 2 via RESPIRATORY_TRACT
  Filled 2021-01-14 (×2): qty 8.8

## 2021-01-14 MED ORDER — IPRATROPIUM-ALBUTEROL 0.5-2.5 (3) MG/3ML IN SOLN
RESPIRATORY_TRACT | Status: AC
  Administered 2021-01-14: 3 mL
  Filled 2021-01-14: qty 3

## 2021-01-14 MED ORDER — IPRATROPIUM-ALBUTEROL 0.5-2.5 (3) MG/3ML IN SOLN
3.0000 mL | Freq: Four times a day (QID) | RESPIRATORY_TRACT | Status: DC
  Administered 2021-01-14 – 2021-01-16 (×5): 3 mL via RESPIRATORY_TRACT
  Filled 2021-01-14 (×6): qty 3

## 2021-01-14 MED ORDER — FUROSEMIDE 40 MG PO TABS
40.0000 mg | ORAL_TABLET | Freq: Two times a day (BID) | ORAL | Status: DC
  Administered 2021-01-15 – 2021-01-16 (×3): 40 mg via ORAL
  Filled 2021-01-14 (×3): qty 1

## 2021-01-14 MED ORDER — DOXAZOSIN MESYLATE 2 MG PO TABS
2.0000 mg | ORAL_TABLET | Freq: Every evening | ORAL | Status: DC
  Administered 2021-01-15: 2 mg via ORAL
  Filled 2021-01-14 (×2): qty 1

## 2021-01-14 MED ORDER — ALBUTEROL SULFATE HFA 108 (90 BASE) MCG/ACT IN AERS
2.0000 | INHALATION_SPRAY | RESPIRATORY_TRACT | Status: DC | PRN
  Filled 2021-01-14: qty 6.7

## 2021-01-14 NOTE — Progress Notes (Signed)
SATURATION QUALIFICATIONS: (This note is used to comply with regulatory documentation for home oxygen)  Patient Saturations on Room Air at Rest = 90%  Patient Saturations on Room Air while Ambulating = 85%  Patient Saturations on 2 Liters of oxygen while Ambulating = 92%  Please briefly explain why patient needs home oxygen: Patient ambulated 20 feet without oxygen and became markedly short of breath, oxygen saturations down to 85% while ambulating and patient had to sit down. Oxygen applied at [facility] and sats after about 2 minutes with oxygen applied came back up to the low 90s. Patient aware of need for home oxygen

## 2021-01-14 NOTE — Progress Notes (Addendum)
Subjective:  Patient had low blood pressure was asymptomatic from this.  He has not had any recurrence of chest pain.  Continues to have dyspnea, no cough.  No PND or orthopnea.  Saturations noted to be dropping down to 90s even just sitting up.  Patient has not walked anything but to the bedside commode.  Intake/Output from previous day:  I/O last 3 completed shifts: In: 1643.6 [P.O.:1250; I.V.:393.6] Out: 2850 [Urine:2850] Total I/O In: -  Out: 1300 [Urine:1300]  Blood pressure 111/64, pulse 64, temperature 98.1 F (36.7 C), temperature source Oral, resp. rate 18, height 6' (1.829 m), weight 111.6 kg, SpO2 97 %. Physical Exam Constitutional:      Appearance: He is obese.  HENT:     Mouth/Throat:     Mouth: Mucous membranes are moist.  Eyes:     Extraocular Movements: Extraocular movements intact.  Cardiovascular:     Rate and Rhythm: Normal rate and regular rhythm.     Pulses: Intact distal pulses.          Carotid pulses are 2+ on the right side and 2+ on the left side.      Dorsalis pedis pulses are 2+ on the right side and 2+ on the left side.       Posterior tibial pulses are 2+ on the right side and 2+ on the left side.     Heart sounds: Heart sounds are distant. No murmur heard. No gallop.      Comments: No leg edema, no JVD. Pulmonary:     Effort: Pulmonary effort is normal.     Breath sounds: Rales (Bilateral basal coarse) present.  Abdominal:     General: Bowel sounds are normal.     Palpations: Abdomen is soft.  Musculoskeletal:     Cervical back: Normal range of motion.  Skin:    General: Skin is warm and dry.  Neurological:     General: No focal deficit present.     Mental Status: He is alert and oriented to person, place, and time.  Psychiatric:        Mood and Affect: Mood normal.     Lab Results: BNP (last 3 results) Recent Labs    01/11/21 2031 01/13/21 0524 01/14/21 0236  BNP 491.7* 237.1* 109.4*    ProBNP (last 3 results) No results for  input(s): PROBNP in the last 8760 hours. BMP Latest Ref Rng & Units 01/14/2021 01/13/2021 01/12/2021  Glucose 70 - 99 mg/dL 101(H) 93 103(H)  BUN 8 - 23 mg/dL 25(H) 22 20  Creatinine 0.61 - 1.24 mg/dL 1.64(H) 2.00(H) 1.41(H)  Sodium 135 - 145 mmol/L 129(L) 131(L) 131(L)  Potassium 3.5 - 5.1 mmol/L 4.0 4.2 4.4  Chloride 98 - 111 mmol/L 98 99 101  CO2 22 - 32 mmol/L 20(L) 22 20(L)  Calcium 8.9 - 10.3 mg/dL 8.7(L) 8.6(L) 8.7(L)   Hepatic Function Latest Ref Rng & Units 01/11/2021  Total Protein 6.5 - 8.1 g/dL 5.9(L)  Albumin 3.5 - 5.0 g/dL 3.0(L)  AST 15 - 41 U/L 72(H)  ALT 0 - 44 U/L 37  Alk Phosphatase 38 - 126 U/L 63  Total Bilirubin 0.3 - 1.2 mg/dL 1.0   CBC Latest Ref Rng & Units 01/14/2021 01/13/2021 01/12/2021  WBC 4.0 - 10.5 K/uL 7.6 6.7 8.4  Hemoglobin 13.0 - 17.0 g/dL 12.0(L) 13.0 12.9(L)  Hematocrit 39.0 - 52.0 % 34.5(L) 35.9(L) 37.5(L)  Platelets 150 - 400 K/uL 190 176 198   Lipid Panel  Component Value Date/Time   CHOL 179 01/11/2021 2030   TRIG 107 01/11/2021 2030   HDL 49 01/11/2021 2030   CHOLHDL 3.7 01/11/2021 2030   VLDL 21 01/11/2021 2030   LDLCALC 109 (H) 01/11/2021 2030   Cardiac Panel (last 3 results) No results for input(s): CKTOTAL, CKMB, TROPONINI, RELINDX in the last 72 hours.  HEMOGLOBIN A1C Lab Results  Component Value Date   HGBA1C 5.3 01/11/2021   MPG 105.41 01/11/2021   TSH No results for input(s): TSH in the last 8760 hours. Imaging:  DG Chest 1 View  Result Date: 01/11/2021 CLINICAL DATA:  Chest pain EXAM: CHEST  1 VIEW COMPARISON:  January 10, 2021 FINDINGS: The heart size and mediastinal contours are mildly enlarged. There is prominence of the central pulmonary vasculature. Patchy airspace opacity is seen at the left lung base. Mildly increased interstitial markings seen at the right lung base. The visualized skeletal structures are unremarkable. IMPRESSION: Bilateral interstitial and patchy airspace opacities at the lung bases which could be due to  multifocal pneumonia or chronic lung changes. Mild pulmonary vascular congestion. Electronically Signed   By: Prudencio Pair M.D.   On: 01/11/2021 19:31   CTA Chest 3/2/202: No PE Patchy peripheral ground-glass opacities and subpleural reticulation, w/some associated architectural distortion b/l, suggesting persistent multifocal pneumonia superimposed on interstitial lung disease (UIP) and/or chronic post infectious change Numerous small and moderately enlarged lymph nodes throughout the mediastinum and b/l perihilar regions, likely reactive. Neoplastic process not excluded, but considered less likely Diffuse coronary artery calcifications   Cardiac Studies: EKG 01/11/3021: Sinus rhythm,  LAFB, Anterolateral infarct, age indeterminate  Echocardiogram 01/12/2021: 1. Left ventricular ejection fraction, by estimation, is 40 to 45%. The left ventricle has mildly decreased function. The left ventricle demonstrates regional wall motion abnormalities (see scoring diagram/findings for description). There is mild left  ventricular hypertrophy. Left ventricular diastolic parameters are consistent with Grade I diastolic dysfunction (impaired relaxation).  2. Right ventricular systolic function was not well visualized. The right ventricular size is not well visualized.  3. The mitral valve is grossly normal. Mild mitral valve regurgitation.  4. The aortic valve is tricuspid. There is mild thickening of the aortic valve. Aortic valve regurgitation is not visualized.   Scheduled Meds: . aspirin EC  81 mg Oral Daily  . atorvastatin  80 mg Oral Daily  . carvedilol  3.125 mg Oral BID  . [START ON 01/15/2021] doxazosin  2 mg Oral QPM  . furosemide  40 mg Intravenous BID  . heparin  5,000 Units Subcutaneous Q8H  . isosorbide dinitrate  30 mg Oral BID  . mometasone-formoterol  2 puff Inhalation BID  . pantoprazole  40 mg Oral Daily  . ranolazine  1,000 mg Oral BID  . sodium chloride flush  3 mL Intravenous  Q12H  . ticagrelor  90 mg Oral BID   Continuous Infusions: . 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  1.  NSTEMI 2.  Multivessel coronary artery disease, extremely complex and calcified vessels. 3.  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  Acute systolic heart failure  5.  COPD with centrilobular emphysema and chronic dyspnea on exertion for the past  3 to 4 years and also recent Covid pneumonia contributing to his dyspnea.  Extensive scarring and infiltrates noted on chest x-ray, suspect he probably has pulmonary fibrosis or significant pulmonary pathology. 6.  Tobacco use disorder 7.  Hyperlipidemia   Recommendation:   Patient's BNP is improved, his underlying shortness of breath is not related to purely congestive heart failure but continues to have marked infiltrates in his lungs out of proportion to his underlying cardiac issues.  Will reduce furosemide and change it to p.o. Lasix, continue low-dose carvedilol.  Change doxazosin to the evening dose.  Continue high intensity statins.  He is not felt to be a candidate for CABG, he will need PCI in the outpatient elective basis.  I am extremely concerned about his pulmonary status as well, would like to have pulmonary opinion regarding discharge medications hence will have pulmonary to come and see him tomorrow prior to discharge.  I would also like to do oxygen saturations at room air and with ambulation, suspect he will need home oxygen.  Renal function has remained stable.  We will continue to trend this.  Not on guideline directed medical therapy in  view of low blood pressure and hypotension.  Eventual plan is to discharge him if he remains stable tomorrow or day after tomorrow, once medically stable from especially pulmonary standpoint and serum creatinine is stable, will consider high risk intervention to left main and circumflex and also probably proximal RCA.  Addendum: Discussed with Dr. Virgina Jock, agree with proceeding with PCI to the culprit RCA, extremely complex anatomy, will save circumflex left main stenting to a later date.  This is to avoid contrast and radiation exposure time.   Adrian Prows, MD, [facility] 01/14/2021, 1:28 PM Office: [phone] Pager: [phone]

## 2021-01-14 NOTE — TOC Initial Note (Signed)
Transition of Care (TOC) - Initial/Assessment Note  Marvetta Gibbons RN, BSN Transitions of Care Unit 4E- RN Case Manager See Treatment Team for direct phone # Weekend Coverage for [place]   Patient Details  Name: Darrell Simpson MRN: [id number] Date of Birth: April 20, 1951  Transition of Care [facility]) CM/SW Contact:    Dawayne Patricia, RN Phone Number: 01/14/2021, 4:06 PM  Clinical Narrative:                 Noted order for home 02 needs, CM spoke with pt at bedside for DME choice and transition needs. Per pt he does not have a preference for DME agency and is agreeable to use in house provider- [employer].  Per pt he lives at home with wife, has needed DME at home that includes BP cuff, pulse ox, walker.  States that wife will plan to transport home. Explained that he will need to wait until transport 02 has been delivered to room- pt voiced understanding.   Call made to [employer] DME line- referral given for home 02 needs- once processed portable transport tank to be delivered to room prior to discharge.   Expected Discharge Plan: Home/Self Care Barriers to Discharge: Continued Medical Work up   Patient Goals and CMS Choice Patient states their goals for this hospitalization and ongoing recovery are:: return home CMS Medicare.gov Compare Post Acute Care list provided to:: Patient Choice offered to / list presented to : Patient  Expected Discharge Plan and Services Expected Discharge Plan: Home/Self Care   Discharge Planning Services: CM Consult Post Acute Care Choice: Durable Medical Equipment Living arrangements for the past 2 months: Single Family Home                 DME Arranged: Oxygen DME Agency: [employer] Date DME Agency Contacted: 01/14/21 Time DME Agency Contacted: [phone] Representative spoke with at DME Agency: Donielle            Prior Living Arrangements/Services Living arrangements for the past 2 months: [place] Lives with:: Spouse Patient language and need  for interpreter reviewed:: Yes Do you feel safe going back to the place where you live?: Yes      Need for Family Participation in Patient Care: Yes (Comment) Care giver support system in place?: Yes (comment) Current home services: DME (BP cuff, pulse ox, walker) Criminal Activity/Legal Involvement Pertinent to Current Situation/Hospitalization: No - Comment as needed  Activities of Daily Living Home Assistive Devices/Equipment: None ADL Screening (condition at time of admission) Patient's cognitive ability adequate to safely complete daily activities?: Yes Is the patient deaf or have difficulty hearing?: No Does the patient have difficulty seeing, even when wearing glasses/contacts?: No Does the patient have difficulty concentrating, remembering, or making decisions?: No Patient able to express need for assistance with ADLs?: Yes Does the patient have difficulty dressing or bathing?: No Independently performs ADLs?: Yes (appropriate for developmental age) Does the patient have difficulty walking or climbing stairs?: No Weakness of Legs: None Weakness of Arms/Hands: None  Permission Sought/Granted Permission sought to share information with : Chartered certified accountant granted to share information with : Yes, Verbal Permission Granted     Permission granted to share info w AGENCY: DME        Emotional Assessment Appearance:: Appears stated age Attitude/Demeanor/Rapport: Engaged Affect (typically observed): Appropriate,Accepting Orientation: : Oriented to Self,Oriented to Place,Oriented to  Time,Oriented to Situation Alcohol / Substance Use: Not Applicable Psych Involvement: No (comment)  Admission diagnosis:  NSTEMI (  non-ST elevated myocardial infarction) [facility]) [I21.4] Patient Active Problem List   Diagnosis Date Noted  . Non-ST elevation (NSTEMI) myocardial infarction ([place]) 01/11/2021  . NSTEMI (non-ST elevated myocardial infarction) ([place]) 01/11/2021    PCP:  Antonietta Jewel, MD Pharmacy:   [facility] [place] - [address] [place] [address] Phone: [phone] Fax: [phone]     Social Determinants of Health (SDOH) Interventions    Readmission Risk Interventions No flowsheet data found.

## 2021-01-15 ENCOUNTER — Encounter (HOSPITAL_COMMUNITY): Payer: Self-pay | Admitting: Cardiology

## 2021-01-15 ENCOUNTER — Inpatient Hospital Stay (HOSPITAL_COMMUNITY)
Admission: EM | Disposition: A | Discharge status: Home / Self Care | Payer: Self-pay | Source: Other Acute Inpatient Hospital | Attending: Cardiology

## 2021-01-15 DIAGNOSIS — J439 Emphysema, unspecified: Secondary | ICD-10-CM | POA: Diagnosis not present

## 2021-01-15 HISTORY — PX: CORONARY BALLOON ANGIOPLASTY: CATH118233

## 2021-01-15 HISTORY — PX: LEFT HEART CATH: CATH118248

## 2021-01-15 LAB — BASIC METABOLIC PANEL
Anion gap: 13 (ref 5–15)
BUN: 25 mg/dL — ABNORMAL HIGH (ref 8–23)
CO2: 19 mmol/L — ABNORMAL LOW (ref 22–32)
Calcium: 9.3 mg/dL (ref 8.9–10.3)
Chloride: 98 mmol/L (ref 98–111)
Creatinine, Ser: 1.64 mg/dL — ABNORMAL HIGH (ref 0.61–1.24)
GFR, Estimated: 45 mL/min — ABNORMAL LOW (ref >60)
Glucose, Bld: 91 mg/dL (ref 70–99)
Potassium: 4.5 mmol/L (ref 3.5–5.1)
Sodium: 130 mmol/L — ABNORMAL LOW (ref 135–145)

## 2021-01-15 LAB — POCT ACTIVATED CLOTTING TIME: Activated Clotting Time: 446 seconds

## 2021-01-15 LAB — BRAIN NATRIURETIC PEPTIDE: B Natriuretic Peptide: 148 pg/mL — ABNORMAL HIGH (ref 0.0–100.0)

## 2021-01-15 SURGERY — LEFT HEART CATH
Anesthesia: LOCAL

## 2021-01-15 MED ORDER — LABETALOL HCL 5 MG/ML IV SOLN: 10.0000 mg | INTRAVENOUS | Status: AC | PRN

## 2021-01-15 MED ORDER — ACETAMINOPHEN 325 MG PO TABS: 650.0000 mg | ORAL_TABLET | ORAL | Status: DC | PRN

## 2021-01-15 MED ORDER — SODIUM CHLORIDE 0.9 % IV SOLN: INTRAVENOUS | Status: DC

## 2021-01-15 MED ORDER — IOHEXOL 350 MG/ML SOLN
INTRAVENOUS | Status: DC | PRN
  Administered 2021-01-15: 75 mL

## 2021-01-15 MED ORDER — BIVALIRUDIN TRIFLUOROACETATE 250 MG IV SOLR
INTRAVENOUS | Status: AC
  Filled 2021-01-15: qty 250

## 2021-01-15 MED ORDER — LIDOCAINE HCL (PF) 1 % IJ SOLN
INTRAMUSCULAR | Status: AC
  Filled 2021-01-15: qty 30

## 2021-01-15 MED ORDER — FENTANYL CITRATE (PF) 100 MCG/2ML IJ SOLN
INTRAMUSCULAR | Status: AC
  Filled 2021-01-15: qty 2

## 2021-01-15 MED ORDER — FENTANYL CITRATE (PF) 100 MCG/2ML IJ SOLN
INTRAMUSCULAR | Status: DC | PRN
  Administered 2021-01-15: 25 ug via INTRAVENOUS

## 2021-01-15 MED ORDER — SODIUM CHLORIDE 0.9% FLUSH: 3.0000 mL | INTRAVENOUS | Status: DC | PRN

## 2021-01-15 MED ORDER — HEPARIN (PORCINE) IN NACL 1000-0.9 UT/500ML-% IV SOLN
INTRAVENOUS | Status: DC | PRN
  Administered 2021-01-15 (×3): 500 mL

## 2021-01-15 MED ORDER — SODIUM CHLORIDE 0.9 % IV SOLN: 250.0000 mL | INTRAVENOUS | Status: DC | PRN

## 2021-01-15 MED ORDER — NITROGLYCERIN 1 MG/10 ML FOR IR/CATH LAB
INTRA_ARTERIAL | Status: AC
  Filled 2021-01-15: qty 10

## 2021-01-15 MED ORDER — SACUBITRIL-VALSARTAN 24-26 MG PO TABS: 1.0000 | ORAL_TABLET | Freq: Two times a day (BID) | ORAL | Status: DC

## 2021-01-15 MED ORDER — BIVALIRUDIN BOLUS VIA INFUSION - CUPID
INTRAVENOUS | Status: DC | PRN
  Administered 2021-01-15: 83.4 mg via INTRAVENOUS

## 2021-01-15 MED ORDER — ONDANSETRON HCL 4 MG/2ML IJ SOLN: 4.0000 mg | Freq: Four times a day (QID) | INTRAMUSCULAR | Status: DC | PRN

## 2021-01-15 MED ORDER — MIDAZOLAM HCL 2 MG/2ML IJ SOLN
INTRAMUSCULAR | Status: DC | PRN
  Administered 2021-01-15: 1 mg via INTRAVENOUS

## 2021-01-15 MED ORDER — SODIUM CHLORIDE 0.9% FLUSH: 3.0000 mL | Freq: Two times a day (BID) | INTRAVENOUS | Status: DC

## 2021-01-15 MED ORDER — SODIUM CHLORIDE 0.9% FLUSH
3.0000 mL | Freq: Two times a day (BID) | INTRAVENOUS | Status: DC
  Administered 2021-01-15: 3 mL via INTRAVENOUS

## 2021-01-15 MED ORDER — MIDAZOLAM HCL 2 MG/2ML IJ SOLN
INTRAMUSCULAR | Status: AC
  Filled 2021-01-15: qty 2

## 2021-01-15 MED ORDER — LIDOCAINE HCL (PF) 1 % IJ SOLN
INTRAMUSCULAR | Status: DC | PRN
  Administered 2021-01-15: 15 mL

## 2021-01-15 MED ORDER — HEPARIN (PORCINE) IN NACL 1000-0.9 UT/500ML-% IV SOLN
INTRAVENOUS | Status: AC
  Filled 2021-01-15: qty 1000

## 2021-01-15 MED ORDER — HYDRALAZINE HCL 20 MG/ML IJ SOLN
10.0000 mg | INTRAMUSCULAR | Status: AC | PRN
Start: 2021-01-15 — End: 2021-01-15

## 2021-01-15 MED ORDER — SODIUM CHLORIDE 0.9 % IV SOLN: INTRAVENOUS | Status: AC

## 2021-01-15 MED ORDER — HEPARIN (PORCINE) IN NACL 1000-0.9 UT/500ML-% IV SOLN
INTRAVENOUS | Status: AC
  Filled 2021-01-15: qty 500

## 2021-01-15 MED ORDER — SODIUM CHLORIDE 0.9 % IV SOLN
INTRAVENOUS | Status: DC | PRN
  Administered 2021-01-15: 1.75 mg/kg/h
  Administered 2021-01-15: 1.75 mg/kg/h via INTRAVENOUS

## 2021-01-15 MED ORDER — NITROGLYCERIN 1 MG/10 ML FOR IR/CATH LAB
INTRA_ARTERIAL | Status: DC | PRN
  Administered 2021-01-15: 100 ug via INTRACORONARY

## 2021-01-15 SURGICAL SUPPLY — 22 items
BALLN EUPHORA RX 1.5X6 (BALLOONS) ×2
BALLN WOLVERINE 2.00X6 (BALLOONS) ×2
BALLOON EUPHORA RX 1.5X6 (BALLOONS) IMPLANT
BALLOON WOLVERINE 2.00X6 (BALLOONS) IMPLANT
CATH OPTICROSS HD (CATHETERS) ×1 IMPLANT
CATH VISTA GUIDE 6FR 3DRC (CATHETERS) ×1 IMPLANT
CATH VISTA GUIDE 6FR H STICK (CATHETERS) ×1 IMPLANT
ELECT DEFIB PAD ADLT CADENCE (PAD) ×1 IMPLANT
KIT ENCORE 26 ADVANTAGE (KITS) ×1 IMPLANT
KIT HEART LEFT (KITS) ×2 IMPLANT
KIT HEMO VALVE WATCHDOG (MISCELLANEOUS) ×1 IMPLANT
KIT MICROPUNCTURE NIT STIFF (SHEATH) ×1 IMPLANT
PACK CARDIAC CATHETERIZATION (CUSTOM PROCEDURE TRAY) ×2 IMPLANT
SHEATH PINNACLE 6F 10CM (SHEATH) ×2 IMPLANT
SHEATH PROBE COVER 6X72 (BAG) ×1 IMPLANT
SLED PULL BACK IVUS (MISCELLANEOUS) ×1 IMPLANT
SLEEVE REPOSITIONING LENGTH 30 (MISCELLANEOUS) ×1 IMPLANT
TRANSDUCER W/STOPCOCK (MISCELLANEOUS) ×2 IMPLANT
TUBING CIL FLEX 10 FLL-RA (TUBING) ×2 IMPLANT
WIRE ASAHI GRAND SLAM 180CM (WIRE) ×1 IMPLANT
WIRE ASAHI PROWATER 180CM (WIRE) ×1 IMPLANT
WIRE EMERALD 3MM-J .035X150CM (WIRE) ×1 IMPLANT

## 2021-01-15 NOTE — Progress Notes (Addendum)
70 year old Caucasian male with hypertension, former >60 PY smoker (quit 2016), CKD, h/o squamous cell skin cancer, h/o COVID pneumonia in 11/2020 with residual fibrotic changes, admitted with non-STEMI.  Diagnostic coronary angiogram on 01/12/2021 showed multivessel CAD with CTO LAD, severely calcified LCx/OM1 bifurcation stenosis, severe proximal RCA stenosis-likely culprit lesion.  CT surgery consult was sought.  Given patient's underlying lung issues, and poor targets for LAD, he was deemed not to be a surgical candidate.  Medical therapy is limited due to hypotension.  Creatinine has relatively stabilized after initial increase following diagnostic coronary angiogram.  I felt that inpatient PCI to culprit RCA, followed by staged outpatient PCI to circumflex OM after stabilization of his pulmonary issues would be his best option.  After discussing risk, benefits, alternate options with the patient and family, they agreed to proceed with RCA PCI.  I will perform IVUS and then decide whether he will need modification techniques such as atherectomy or shockwave lithotripsy.  I quoted 3-4% risk of periprocedural complications, including but not limited to, bleeding, infection, dissection, perforation, acute MI, arrhythmia, death.  Temporary pacemaker may also be needed.  Patient consented for all.   Nigel Mormon, MD Pager: [phone] Office: [phone]

## 2021-01-15 NOTE — H&P (View-Only) (Signed)
70 year old Caucasian male with hypertension, former >60 PY smoker (quit 2016), CKD, h/o squamous cell skin cancer, h/o COVID pneumonia in 11/2020 with residual fibrotic changes, admitted with non-STEMI.  Diagnostic coronary angiogram on 01/12/2021 showed multivessel CAD with CTO LAD, severely calcified LCx/OM1 bifurcation stenosis, severe proximal RCA stenosis-likely culprit lesion.  CT surgery consult was sought.  Given patient's underlying lung issues, and poor targets for LAD, he was deemed not to be a surgical candidate.  Medical therapy is limited due to hypotension.  Creatinine has relatively stabilized after initial increase following diagnostic coronary angiogram.  I felt that inpatient PCI to culprit RCA, followed by staged outpatient PCI to circumflex OM after stabilization of his pulmonary issues would be his best option.  After discussing risk, benefits, alternate options with the patient and family, they agreed to proceed with RCA PCI.  I will perform IVUS and then decide whether he will need modification techniques such as atherectomy or shockwave lithotripsy.  I quoted 3-4% risk of periprocedural complications, including but not limited to, bleeding, infection, dissection, perforation, acute MI, arrhythmia, death.  Temporary pacemaker may also be needed.  Patient consented for all.   Nigel Mormon, MD Pager: [phone] Office: [phone]

## 2021-01-15 NOTE — Progress Notes (Signed)
Subjective:  Patient seen and examined at bedside at approximately 8:25 AM. Patient denies any chest pain. He has been experiencing dyspnea on exertion while he ambulates from the bed to his bathroom.  He also has been having orthopnea.  He was informed that it could be secondary to his Brilinta.  Intake/Output from previous day:  I/O last 3 completed shifts: In: 69 [P.O.:480; I.V.:6] Out: 4050 [Urine:4050] No intake/output data recorded.  Blood pressure 122/72, pulse 63, temperature (!) 97.5 F (36.4 C), temperature source Oral, resp. rate 18, height 6' (1.829 m), weight 111.2 kg, SpO2 97 %. Physical Exam Constitutional:      Appearance: He is obese.  HENT:     Mouth/Throat:     Mouth: Mucous membranes are moist.  Eyes:     Extraocular Movements: Extraocular movements intact.  Cardiovascular:     Rate and Rhythm: Normal rate and regular rhythm.     Pulses: Intact distal pulses.          Carotid pulses are 2+ on the right side and 2+ on the left side.      Dorsalis pedis pulses are 2+ on the right side and 2+ on the left side.       Posterior tibial pulses are 2+ on the right side and 2+ on the left side.     Heart sounds: Heart sounds are distant. No murmur heard. No gallop.      Comments: No leg edema, no JVD. Pulmonary:     Effort: Pulmonary effort is normal.     Breath sounds: Rales (Bilateral basal coarse) present.  Abdominal:     General: Bowel sounds are normal.     Palpations: Abdomen is soft.  Musculoskeletal:     Cervical back: Normal range of motion.  Skin:    General: Skin is warm and dry.  Neurological:     General: No focal deficit present.     Mental Status: He is alert and oriented to person, place, and time.  Psychiatric:        Mood and Affect: Mood normal.     Lab Results: BNP (last 3 results) Recent Labs    01/13/21 0524 01/14/21 0236 01/15/21 0241  BNP 237.1* 109.4* 148.0*    ProBNP (last 3 results) No results for input(s): PROBNP in  the last 8760 hours. BMP Latest Ref Rng & Units 01/15/2021 01/14/2021 01/13/2021  Glucose 70 - 99 mg/dL 91 101(H) 93  BUN 8 - 23 mg/dL 25(H) 25(H) 22  Creatinine 0.61 - 1.24 mg/dL 1.64(H) 1.64(H) 2.00(H)  Sodium 135 - 145 mmol/L 130(L) 129(L) 131(L)  Potassium 3.5 - 5.1 mmol/L 4.5 4.0 4.2  Chloride 98 - 111 mmol/L 98 98 99  CO2 22 - 32 mmol/L 19(L) 20(L) 22  Calcium 8.9 - 10.3 mg/dL 9.3 8.7(L) 8.6(L)   Hepatic Function Latest Ref Rng & Units 01/11/2021  Total Protein 6.5 - 8.1 g/dL 5.9(L)  Albumin 3.5 - 5.0 g/dL 3.0(L)  AST 15 - 41 U/L 72(H)  ALT 0 - 44 U/L 37  Alk Phosphatase 38 - 126 U/L 63  Total Bilirubin 0.3 - 1.2 mg/dL 1.0   CBC Latest Ref Rng & Units 01/14/2021 01/13/2021 01/12/2021  WBC 4.0 - 10.5 K/uL 7.6 6.7 8.4  Hemoglobin 13.0 - 17.0 g/dL 12.0(L) 13.0 12.9(L)  Hematocrit 39.0 - 52.0 % 34.5(L) 35.9(L) 37.5(L)  Platelets 150 - 400 K/uL 190 176 198   Lipid Panel     Component Value Date/Time   CHOL 179 01/11/2021  2030   TRIG 107 01/11/2021 2030   HDL 49 01/11/2021 2030   CHOLHDL 3.7 01/11/2021 2030   VLDL 21 01/11/2021 2030   LDLCALC 109 (H) 01/11/2021 2030   Cardiac Panel (last 3 results) No results for input(s): CKTOTAL, CKMB, TROPONINI, RELINDX in the last 72 hours.  HEMOGLOBIN A1C Lab Results  Component Value Date   HGBA1C 5.3 01/11/2021   MPG 105.41 01/11/2021   TSH No results for input(s): TSH in the last 8760 hours. Imaging:  DG Chest 1 View  Result Date: 01/11/2021 CLINICAL DATA:  Chest pain EXAM: CHEST  1 VIEW COMPARISON:  January 10, 2021 FINDINGS: The heart size and mediastinal contours are mildly enlarged. There is prominence of the central pulmonary vasculature. Patchy airspace opacity is seen at the left lung base. Mildly increased interstitial markings seen at the right lung base. The visualized skeletal structures are unremarkable. IMPRESSION: Bilateral interstitial and patchy airspace opacities at the lung bases which could be due to multifocal pneumonia or  chronic lung changes. Mild pulmonary vascular congestion. Electronically Signed   By: Prudencio Pair M.D.   On: 01/11/2021 19:31   CTA Chest 3/2/202: No PE Patchy peripheral ground-glass opacities and subpleural reticulation, w/some associated architectural distortion b/l, suggesting persistent multifocal pneumonia superimposed on interstitial lung disease (UIP) and/or chronic post infectious change Numerous small and moderately enlarged lymph nodes throughout the mediastinum and b/l perihilar regions, likely reactive. Neoplastic process not excluded, but considered less likely Diffuse coronary artery calcifications   Cardiac Studies: EKG 01/11/3021: Sinus rhythm,  LAFB, Anterolateral infarct, age indeterminate  Echocardiogram 01/12/2021: 1. Left ventricular ejection fraction, by estimation, is 40 to 45%. The left ventricle has mildly decreased function. The left ventricle demonstrates regional wall motion abnormalities (see scoring diagram/findings for description). There is mild left  ventricular hypertrophy. Left ventricular diastolic parameters are consistent with Grade I diastolic dysfunction (impaired relaxation).  2. Right ventricular systolic function was not well visualized. The right ventricular size is not well visualized.  3. The mitral valve is grossly normal. Mild mitral valve regurgitation.  4. The aortic valve is tricuspid. There is mild thickening of the aortic valve. Aortic valve regurgitation is not visualized.   Scheduled Meds: . aspirin EC  81 mg Oral Daily  . atorvastatin  80 mg Oral Daily  . carvedilol  3.125 mg Oral BID  . doxazosin  2 mg Oral QPM  . furosemide  40 mg Oral BID  . heparin  5,000 Units Subcutaneous Q8H  . ipratropium-albuterol  3 mL Nebulization QID  . isosorbide dinitrate  30 mg Oral BID  . mometasone-formoterol  2 puff Inhalation BID  . pantoprazole  40 mg Oral Daily  . ranolazine  1,000 mg Oral BID  . sodium chloride flush  3 mL Intravenous Q12H   . ticagrelor  90 mg Oral BID   Continuous Infusions: . 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  1.  NSTEMI 2.  Multivessel coronary artery disease, extremely complex and calcified vessels. 3.  Acute on chronic renal failure stage IIIa 4.  Acute systolic heart failure  5.  COPD with centrilobular emphysema and chronic dyspnea on exertion for the past 3 to 4  years and also recent Covid pneumonia contributing to his dyspnea.  Extensive scarring and infiltrates noted on chest x-ray, suspect he probably has pulmonary fibrosis or significant pulmonary pathology. 6.  Tobacco use disorder 7.  Hyperlipidemia 8.  Hyponatremia    Recommendation:   Patient is scheduled for a left heart catheterization with intervention planned for the right coronary artery later today with Dr. Virgina Jock.  Patient is aware with the plan of care. The procedure of left heart catheterization with possible intervention was explained to the patient in detail. The indication, alternatives, risks and benefits were reviewed.  Complications include but not limited to bleeding, infection, vascular injury, stroke, myocardial infection, arrhythmia, kidney injury requiring hemodialysis, radiation-related injury in the case of prolonged fluoroscopy use, emergency cardiac surgery, and death. The patient understands the risks of serious complication is 1-2 in 0982 with diagnostic cardiac cath and 1-2% or less with angioplasty/stenting. The patient voices understanding and provides verbal feedback and wishes to proceed with coronary angiography with  possible PCI.  Continue aspirin and high intensity statin therapy.  With regards to his dyspnea patient is already on diuretic therapy.  Will discuss with interventional cardiology with regards to transitioning him from Brilinta to Plavix given his shortness of breath.  Pulmonary medicine has been consulted as there may be noncardiac causes of his dyspnea as well given his CT findings.  Continue the current Lasix dose for now until left heart catheterization is complete.  Strict I's and O's recommended.  A net negative urine output of approximately 6 L since admission.  Antianginal therapies include beta-blockers, Ranexa, Imdur.  Telemetry reviewed.  Yesterday patient did qualify for home oxygen therapy.  Case manager made aware and coordinating care.  We will uptitrate his guideline directed medical therapy based on his hemodynamics and laboratory values.  Rex Kras, [place], [facility]  Pager: [phone] Office: [phone]

## 2021-01-15 NOTE — Progress Notes (Signed)
Heart Failure Stewardship Pharmacist Progress Note   PCP: Antonietta Jewel, MD PCP-Cardiologist: No primary care provider on file.    HPI:  70 yo M with PMH of HTN, CKD, squamous cell skin cancer, COVID-19 PNA in 11/2020, tobacco use, and COPD. He presented to [facility] ED on 01/10/21 with chest pain and was transferred to [facility] on 01/12/21 for NSTEMI workup. An ECHO was done on 01/12/21 and LVEF was 40-45%. 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 Underwent CABG evaluation but was not a candidate at this time due to underlying pulmonary issues and AKI. Pending elective PCI to RCA as an inpatient, and may undergo left main stenting at another time.   Current HF Medications: Furosemide 40 mg PO BID Carvedilol 3.125 mg BID Isordil 30 mg BID  Prior to admission HF Medications: Furosemide 40 mg TID Carvedilol 6.25 mg BID Lisinopril 20 mg daily  Pertinent Lab Values: . Serum creatinine 1.64, BUN 25, Potassium 4.5, Sodium 130, BNP 497.7  Vital Signs: . Weight: 245 lbs (admission weight: 247 lbs) . Blood pressure: 110-130/70s  . Heart rate: 70s  Medication Assistance / Insurance Benefits Check: Does the patient have prescription insurance?  Yes Type of insurance plan: UHC Medicare  Does the patient qualify for medication assistance through manufacturers or grants?   Pending . Eligible grants and/or patient assistance programs: pending . Medication assistance applications in progress: none  . Medication assistance applications approved: none Approved medication assistance renewals will be completed by: pending  Outpatient Pharmacy:  Prior to admission outpatient pharmacy: [facility] Is the patient willing to use [facility] at discharge? Yes Is the patient willing to transition their outpatient pharmacy to utilize a [facility] outpatient pharmacy?   Pending    Assessment: 1. Acute systolic CHF (EF 69-62%), due to ICM. NYHA class II symptoms. - Continue  furosemide 40 mg BID - Continue carvedilol 3.125 mg BID. Consider increasing to PTA dose of 6.25 mg BID - Consider starting ARB or Entresto instead of lisinopril following cath - Consider starting spironolactone and/or SGLT2i prior to discharge pending SCr trends   Plan: 1) Medication changes recommended at this time: - Continue current regimen pending cath  2) Patient assistance:  - Entresto copay $9.85 per month - Farxiga/Jardiance copay $9.85 per month   3)  Education  - To be completed prior to discharge  Kerby Nora, PharmD, BCPS Heart Failure Stewardship Pharmacist Phone [phone]

## 2021-01-15 NOTE — Consult Note (Signed)
NAME:  Darrell Simpson, MRN:  [id number], DOB:  [birth year], LOS: 4 ADMISSION DATE:  01/11/2021, CONSULTATION DATE:  01/12/21 REFERRING MD:  Virgina Jock - Cards, CHIEF COMPLAINT:  Dyspnea, Hx COVID   Brief History:  70 yo M admitted to [facility] from [place] for NSTEMI. Hx COVID (+ 11/12/20) and has had persistent dyspnea. CCM consult in this setting  History of Present Illness:  70 yo M PMH HTN, CKD, recent covid PNA who transferred to [facility] 3/3 from [place] for Chest pain-- admitted to cards service for NSTEMI. Chest discomfort has been longstanding with varying qualities (burning, cramping, pain) x 3+ years. Assocaited dyspnea for 2-3 years most notably DOE. Quit smoking 8 years ago (formerly was 1ppd x "over 20 years") but did start vaping when he quit smoking. Quit smoking Jan 2022 after contracting COVID. Recent Hx COVID (positive 11/12/20), after which pt was started on PRN albuterol which he does not feel helps his SOB. 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   CCM consulted 3/4 for dyspnea.   Past Medical History:  CKD Squamous cell carcinoma  Hx COVID PNA Psoriatic arthritis GERD HTN \\CHF  Diverticulosis  Hx tobacco dependence  Significant Hospital Events:  3/3 transferred from [facility] hospital to [facility] for NSTEMI. 3/4 PCCM consult for DOE, CTA chest findings. Plan for LHC with cards  Consults:  PCCM  Procedures:    Significant Diagnostic Tests:  3/2 CTA chest at OSH: Emphysematous changes. patchy bilateral GGO and subpleural reticulation.  No evidence of PE on CTA. Numerous small and moderately enlarged mediastinal lymph notes, perihilar nodes.   Micro Data:  COVID PCR 3/3 positive >> d/w IP >> pt does NOT require precautions (covid  + 11/12/20)   Antimicrobials:    Interim History / Subjective:  No acute events over the weekend. Patient is going for heart cath today for revascularization of the  RCA.  Objective   Blood pressure 130/71, pulse 74, temperature (!) 97.5 F (36.4 C), temperature source Oral, resp. rate 18, height 6' (1.829 m), weight 111.2 kg, SpO2 97 %.    FiO2 (%):  [28 %-32 %] 32 %   Intake/Output Summary (Last 24 hours) at 01/15/2021 1458 Last data filed at 01/15/2021 0559 Gross per 24 hour  Intake 243 ml  Output 850 ml  Net -607 ml   Filed Weights   01/13/21 0410 01/14/21 0812 01/15/21 0558  Weight: 111.9 kg 111.6 kg 111.2 kg    Examination: General: elderly male, no acute distress, lying in bed HENT: NCAT. Poor dentition. Anicteric sclera Lungs: clear to auscultation, no wheezing or rhonchi Cardiovascular: rrr, s1s2, no murmurs Abdomen: soft round ndnt  Extremities: no acute deformity, no cyanosis or clubbing.  Neuro: AAOx4 no focal deficit GU: no foley  Resolved Hospital Problem list     Assessment & Plan:   NSTEMI HTN ECG with age indeterminate anterolateral infarct  P -ASA, statin, heparin, BB, nitro, lasix, antihypertensives per cards  -ECHO LV EF 40-45%. Regional wall motion abnormalities present. RV not well visualized. 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  -has some enlarged lymph notes on CT-- suspect reactive  P -He will need OP pulm follow up & PFTs. Request placed to our clinic to schedule him for follow up. - Continue inhaled corticosteroid and long acting beta agonist inhaler therapy - PRN nebulizer treatments    Labs   CBC: Recent Labs  Lab 01/11/21 2030 01/12/21 0433 01/13/21 0524 01/14/21 0236  WBC 7.9 8.4 6.7 7.6  NEUTROABS  --  5.3 3.8 4.6  HGB 12.9* 12.9* 13.0 12.0*  HCT  37.4* 37.5* 35.9* 34.5*  MCV 93.5 93.5 92.1 93.5  PLT 211 198 176 016    Basic Metabolic Panel: Recent Labs  Lab 01/11/21 2030 01/12/21 0433 01/13/21 0524 01/14/21 0236 01/15/21 0241  NA 132* 131* 131* 129* 130*  K 4.5 4.4 4.2 4.0 4.5  CL 100 101 99 98 98  CO2 23 20* 22 20* 19*  GLUCOSE 114* 103* 93 101* 91  BUN 22 20 22  25* 25*  CREATININE 1.61* 1.41* 2.00* 1.64* 1.64*  CALCIUM 8.6* 8.7* 8.6* 8.7* 9.3   GFR: Estimated Creatinine Clearance: 55.5 mL/min (A) (by C-G formula based on SCr of 1.64 mg/dL (H)). Recent Labs  Lab 01/11/21 2030 01/12/21 0433 01/13/21 0524 01/14/21 0236  WBC 7.9 8.4 6.7 7.6    Liver Function Tests: Recent Labs  Lab 01/11/21 2030  AST 72*  ALT 37  ALKPHOS 63  BILITOT 1.0  PROT 5.9*  ALBUMIN 3.0*   No results for input(s): LIPASE, AMYLASE in the last 168 hours. No results for input(s): AMMONIA in the last 168 hours.  ABG No results found for: PHART, PCO2ART, PO2ART, HCO3, TCO2, ACIDBASEDEF, O2SAT   Coagulation Profile: No results for input(s): INR, PROTIME in the last 168 hours.  Cardiac Enzymes: No results for input(s): CKTOTAL, CKMB, CKMBINDEX, TROPONINI in the last 168 hours.  HbA1C: Hgb A1c MFr Bld  Date/Time Value Ref Range Status  01/11/2021 08:30 PM 5.3 4.8 - 5.6 % Final    Comment:    (NOTE) Pre diabetes:          5.7%-6.4%  Diabetes:              >6.4%  Glycemic control for   <7.0% adults with diabetes     CBG: No results for input(s): GLUCAP in the last 168 hours.  Freda Jackson, MD [facility] Office: [phone]   See Amion for Pager Details

## 2021-01-15 NOTE — Interval H&P Note (Signed)
History and Physical Interval Note:  01/15/2021 2:39 PM  KELLIN BARTLING  has presented today for surgery, with the diagnosis of cad.  The various methods of treatment have been discussed with the patient and family. After consideration of risks, benefits and other options for treatment, the patient has consented to  Procedure(s): CORONARY STENT INTERVENTION (N/A) as a surgical intervention.  The patient's history has been reviewed, patient examined, no change in status, stable for surgery.  I have reviewed the patient's chart and labs.  Questions were answered to the patient's satisfaction.    2016 Appropriate Use Criteria for Coronary Revascularization in Patients With Acute Coronary Syndrome NSTEMI/UA High Risk (TIMI Score 5-7) NSTEMI/Unstable angina, stabilized patient at high risk Link Here: sistemancia.com Indication:  Revascularization by PCI or CABG of 1 or more arteries in a patient with NSTEMI or unstable angina with Stabilization after presentation High risk for clinical events  A (7) Indication: 16; Score 7   [place]

## 2021-01-15 NOTE — Plan of Care (Signed)
  Problem: Education: Goal: Ability to demonstrate management of disease process will improve Outcome: Progressing Goal: Ability to verbalize understanding of medication therapies will improve Outcome: Progressing Goal: Individualized Educational Video(s) Outcome: Progressing   Problem: Activity: Goal: Capacity to carry out activities will improve Outcome: Progressing   Problem: Education: Goal: Knowledge of General Education information will improve Description: Including pain rating scale, medication(s)/side effects and non-pharmacologic comfort measures Outcome: Progressing   Problem: Health Behavior/Discharge Planning: Goal: Ability to manage health-related needs will improve Outcome: Progressing   Problem: Clinical Measurements: Goal: Respiratory complications will improve Outcome: Progressing Goal: Cardiovascular complication will be avoided Outcome: Progressing   Problem: Activity: Goal: Risk for activity intolerance will decrease Outcome: Progressing   Problem: Education: Goal: Understanding of cardiac disease, CV risk reduction, and recovery process will improve Outcome: Progressing Goal: Individualized Educational Video(s) Outcome: Progressing   Problem: Activity: Goal: Ability to tolerate increased activity will improve Outcome: Progressing   Problem: Cardiac: Goal: Ability to achieve and maintain adequate cardiovascular perfusion will improve Outcome: Progressing   Problem: Health Behavior/Discharge Planning: Goal: Ability to safely manage health-related needs after discharge will improve Outcome: Progressing

## 2021-01-15 NOTE — Progress Notes (Signed)
Site area: right groin  Site Prior to Removal:  Level 0  Pressure Applied For 20 MINUTES    Minutes Beginning at 1850  Manual:   Yes.    Patient Status During Pull:  stable  Post Pull Groin Site:  Level 0  Post Pull Instructions Given:  Yes.    Post Pull Pulses Present:  Yes.    Dressing Applied:  Yes.    Comments:  Bedrest started at 1910 for 4 hours

## 2021-01-16 ENCOUNTER — Other Ambulatory Visit: Payer: Self-pay | Admitting: Cardiology

## 2021-01-16 ENCOUNTER — Other Ambulatory Visit (HOSPITAL_COMMUNITY): Payer: Medicare Other

## 2021-01-16 ENCOUNTER — Encounter (HOSPITAL_COMMUNITY): Payer: Self-pay | Admitting: Cardiology

## 2021-01-16 LAB — CBC
HCT: 37.7 % — ABNORMAL LOW (ref 39.0–52.0)
Hemoglobin: 13.6 g/dL (ref 13.0–17.0)
MCH: 32.9 pg (ref 26.0–34.0)
MCHC: 36.1 g/dL — ABNORMAL HIGH (ref 30.0–36.0)
MCV: 91.3 fL (ref 80.0–100.0)
Platelets: 204 10*3/uL (ref 150–400)
RBC: 4.13 MIL/uL — ABNORMAL LOW (ref 4.22–5.81)
RDW: 13.2 % (ref 11.5–15.5)
WBC: 7.6 10*3/uL (ref 4.0–10.5)
nRBC: 0 % (ref 0.0–0.2)

## 2021-01-16 LAB — BASIC METABOLIC PANEL
Anion gap: 11 (ref 5–15)
BUN: 27 mg/dL — ABNORMAL HIGH (ref 8–23)
CO2: 23 mmol/L (ref 22–32)
Calcium: 9.3 mg/dL (ref 8.9–10.3)
Chloride: 96 mmol/L — ABNORMAL LOW (ref 98–111)
Creatinine, Ser: 1.86 mg/dL — ABNORMAL HIGH (ref 0.61–1.24)
GFR, Estimated: 39 mL/min — ABNORMAL LOW (ref >60)
Glucose, Bld: 100 mg/dL — ABNORMAL HIGH (ref 70–99)
Potassium: 4 mmol/L (ref 3.5–5.1)
Sodium: 130 mmol/L — ABNORMAL LOW (ref 135–145)

## 2021-01-16 MED ORDER — MOMETASONE FURO-FORMOTEROL FUM 100-5 MCG/ACT IN AERO: 2.0000 | INHALATION_SPRAY | Freq: Two times a day (BID) | RESPIRATORY_TRACT | 1 refills | Status: DC

## 2021-01-16 MED ORDER — PANTOPRAZOLE SODIUM 40 MG PO TBEC: 40.0000 mg | DELAYED_RELEASE_TABLET | Freq: Every day | ORAL | 1 refills | Status: DC

## 2021-01-16 MED ORDER — IPRATROPIUM-ALBUTEROL 0.5-2.5 (3) MG/3ML IN SOLN: 3.0000 mL | Freq: Four times a day (QID) | RESPIRATORY_TRACT | 1 refills | Status: AC

## 2021-01-16 MED ORDER — ASPIRIN 81 MG PO TBEC: 81.0000 mg | DELAYED_RELEASE_TABLET | Freq: Every day | ORAL | 11 refills | Status: DC

## 2021-01-16 MED ORDER — RANOLAZINE ER 1000 MG PO TB12: 1000.0000 mg | ORAL_TABLET | Freq: Two times a day (BID) | ORAL | 1 refills | Status: DC

## 2021-01-16 MED ORDER — TICAGRELOR 90 MG PO TABS: 90.0000 mg | ORAL_TABLET | Freq: Two times a day (BID) | ORAL | 1 refills | Status: DC

## 2021-01-16 MED ORDER — CARVEDILOL 6.25 MG PO TABS: 3.1250 mg | ORAL_TABLET | Freq: Two times a day (BID) | ORAL | 1 refills | Status: DC

## 2021-01-16 MED ORDER — NITROGLYCERIN 0.4 MG SL SUBL: 0.4000 mg | SUBLINGUAL_TABLET | SUBLINGUAL | 1 refills | Status: DC | PRN

## 2021-01-16 MED ORDER — ATORVASTATIN CALCIUM 80 MG PO TABS: 80.0000 mg | ORAL_TABLET | Freq: Every day | ORAL | 1 refills | Status: DC

## 2021-01-16 MED ORDER — FUROSEMIDE 40 MG PO TABS: 40.0000 mg | ORAL_TABLET | Freq: Every day | ORAL | 1 refills | Status: DC

## 2021-01-16 MED ORDER — IPRATROPIUM-ALBUTEROL 0.5-2.5 (3) MG/3ML IN SOLN: 3.0000 mL | Freq: Two times a day (BID) | RESPIRATORY_TRACT | Status: DC

## 2021-01-16 MED ORDER — DOXAZOSIN MESYLATE 2 MG PO TABS: 1.0000 mg | ORAL_TABLET | Freq: Every day | ORAL | 0 refills | Status: DC

## 2021-01-16 MED FILL — IPRAT-ALBUT 0.5-3(2.5) MG/3: 0.5-2.5 (3) | 30 days supply | Qty: 360 | Fill #0

## 2021-01-16 MED FILL — ASPIRIN LOW DOSE 81 MG TBEC: 81 | 30 days supply | Qty: 30 | Fill #0

## 2021-01-16 MED FILL — ATORVASTATIN CALCIUM 80 MG: 80 | 30 days supply | Qty: 30 | Fill #0

## 2021-01-16 MED FILL — NITROGLYCERIN 0.4 MG TAB SL: 0.4 | 7 days supply | Qty: 25 | Fill #0

## 2021-01-16 MED FILL — CARVEDILOL 3.125 MG TABLET: 3.125 | 30 days supply | Qty: 60 | Fill #0

## 2021-01-16 MED FILL — PANTOPRAZOLE SOD DR 40 MG T: 40 | 30 days supply | Qty: 30 | Fill #0

## 2021-01-16 MED FILL — RANOLAZINE ER 1000 MG TB12: 1000 | 30 days supply | Qty: 60 | Fill #0

## 2021-01-16 MED FILL — DULERA 100-5 MCG/ACT AERO: 100-5 | 30 days supply | Qty: 13 | Fill #0

## 2021-01-16 MED FILL — BRILINTA 90 MG TABLET: 90 | 30 days supply | Qty: 60 | Fill #0

## 2021-01-16 MED FILL — FUROSEMIDE 40 MG TABLET: 40 | 30 days supply | Qty: 30 | Fill #0

## 2021-01-16 MED FILL — Verapamil HCl IV Soln 2.5 MG/ML: INTRAVENOUS | Qty: 2 | Status: AC

## 2021-01-16 NOTE — Consult Note (Signed)
NAME:  Darrell Simpson, MRN:  [id number], DOB:  [birth year], LOS: 5 ADMISSION DATE:  01/11/2021, CONSULTATION DATE:  01/12/21 REFERRING MD:  Virgina Jock - Cards, CHIEF COMPLAINT:  Dyspnea, Hx COVID   Brief History:  70 yo M admitted to [facility] from [place] for NSTEMI. Hx COVID (+ 11/12/20) and has had persistent dyspnea. CCM consult in this setting  History of Present Illness:  70 yo M PMH HTN, CKD, recent covid PNA who transferred to [facility] 3/3 from [place] for Chest pain-- admitted to cards service for NSTEMI. Chest discomfort has been longstanding with varying qualities (burning, cramping, pain) x 3+ years. Assocaited dyspnea for 2-3 years most notably DOE. Quit smoking 8 years ago (formerly was 1ppd x "over 20 years") but did start vaping when he quit smoking. Quit smoking Jan 2022 after contracting COVID. Recent Hx COVID (positive 11/12/20), after which pt was started on PRN albuterol which he does not feel helps his SOB. 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   CCM consulted 3/4 for dyspnea.   Past Medical History:  CKD Squamous cell carcinoma  Hx COVID PNA Psoriatic arthritis GERD HTN \\CHF  Diverticulosis  Hx tobacco dependence  Significant Hospital Events:  3/3 transferred from [facility] hospital to [facility] for NSTEMI. 3/4 PCCM consult for DOE, CTA chest findings. Plan for LHC with cards  Consults:  PCCM  Procedures:  3/7 LHC  Significant Diagnostic Tests:  3/2 CTA chest at OSH: Emphysematous changes. patchy bilateral GGO and subpleural reticulation.  No evidence of PE on CTA. Numerous small and moderately enlarged mediastinal lymph notes, perihilar nodes.   Micro Data:  COVID PCR 3/3 positive >> d/w IP >> pt does NOT require precautions (covid  + 11/12/20)   Antimicrobials:    Interim History / Subjective:  Patient doing well after left heart cath and stent placement in RCA  Objective   Blood pressure  108/66, pulse 62, temperature 97.7 F (36.5 C), temperature source Oral, resp. rate 18, height 6' (1.829 m), weight 107.4 kg, SpO2 95 %.    FiO2 (%):  [32 %] 32 %   Intake/Output Summary (Last 24 hours) at 01/16/2021 1117 Last data filed at 01/16/2021 0500 Gross per 24 hour  Intake 1677.59 ml  Output 3800 ml  Net -2122.41 ml   Filed Weights   01/14/21 0812 01/15/21 0558 01/16/21 0401  Weight: 111.6 kg 111.2 kg 107.4 kg    Examination: General: elderly male, no acute distress, lying in bed HENT: NCAT. Poor dentition. Anicteric sclera Lungs: clear to auscultation, no wheezing or rhonchi Cardiovascular: rrr, s1s2, no murmurs Abdomen: soft round ndnt  Extremities: no acute deformity, no cyanosis or clubbing.  Neuro: AAOx4 no focal deficit GU: no foley  Resolved Hospital Problem list     Assessment & Plan:   NSTEMI HTN ECG with age indeterminate anterolateral infarct  P -ASA, statin, heparin, BB, nitro, lasix, antihypertensives per cards  -ECHO LV EF 40-45%. Regional wall motion abnormalities present. RV not well visualized. 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  -has some enlarged lymph notes on CT-- suspect reactive  P -He will  need OP pulm follow up & PFTs. Request placed to our clinic to schedule him for follow up. - Continue inhaled corticosteroid and long acting beta agonist inhaler therapy - PRN nebulizer treatments - safe for discharge from a respiratory standpoint.   Labs   CBC: Recent Labs  Lab 01/11/21 2030 01/12/21 0433 01/13/21 0524 01/14/21 0236 01/16/21 0253  WBC 7.9 8.4 6.7 7.6 7.6  NEUTROABS  --   5.3 3.8 4.6  --   HGB 12.9* 12.9* 13.0 12.0* 13.6  HCT 37.4* 37.5* 35.9* 34.5* 37.7*  MCV 93.5 93.5 92.1 93.5 91.3  PLT 211 198 176 190 354    Basic Metabolic Panel: Recent Labs  Lab 01/12/21 0433 01/13/21 0524 01/14/21 0236 01/15/21 0241 01/16/21 0253  NA 131* 131* 129* 130* 130*  K 4.4 4.2 4.0 4.5 4.0  CL 101 99 98 98 96*  CO2 20* 22 20* 19* 23  GLUCOSE 103* 93 101* 91 100*  BUN 20 22 25* 25* 27*  CREATININE 1.41* 2.00* 1.64* 1.64* 1.86*  CALCIUM 8.7* 8.6* 8.7* 9.3 9.3   GFR: Estimated Creatinine Clearance: 48.1 mL/min (A) (by C-G formula based on SCr of 1.86 mg/dL (H)). Recent Labs  Lab 01/12/21 0433 01/13/21 0524 01/14/21 0236 01/16/21 0253  WBC 8.4 6.7 7.6 7.6    Liver Function Tests: Recent Labs  Lab 01/11/21 2030  AST 72*  ALT 37  ALKPHOS 63  BILITOT 1.0  PROT 5.9*  ALBUMIN 3.0*   No results for input(s): LIPASE, AMYLASE in the last 168 hours. No results for input(s): AMMONIA in the last 168 hours.  ABG No results found for: PHART, PCO2ART, PO2ART, HCO3, TCO2, ACIDBASEDEF, O2SAT   Coagulation Profile: No results for input(s): INR, PROTIME in the last 168 hours.  Cardiac Enzymes: No results for input(s): CKTOTAL, CKMB, CKMBINDEX, TROPONINI in the last 168 hours.  HbA1C: Hgb A1c MFr Bld  Date/Time Value Ref Range Status  01/11/2021 08:30 PM 5.3 4.8 - 5.6 % Final    Comment:    (NOTE) Pre diabetes:          5.7%-6.4%  Diabetes:              >6.4%  Glycemic control for   <7.0% adults with diabetes     CBG: No results for input(s): GLUCAP in the last 168 hours.  Freda Jackson, MD [facility] Office: [phone]   See Amion for Pager Details

## 2021-01-16 NOTE — Progress Notes (Signed)
Heart Failure Stewardship Pharmacist Progress Note   PCP: Antonietta Jewel, MD PCP-Cardiologist: No primary care provider on file.    HPI:  70 yo M with PMH of HTN, CKD, squamous cell skin cancer, COVID-19 PNA in 11/2020, tobacco use, and COPD. He presented to [facility] ED on 01/10/21 with chest pain and was transferred to [facility] on 01/12/21 for NSTEMI workup. An ECHO was done on 01/12/21 and LVEF was 40-45%. 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 Underwent CABG evaluation but was not a candidate at this time due to underlying pulmonary issues and AKI. Attempted elective PCI to RCA as an inpatient but was unsuccessful.   Current HF Medications: Furosemide 40 mg PO BID Carvedilol 3.125 mg BID  Prior to admission HF Medications: Furosemide 40 mg TID Carvedilol 6.25 mg BID Lisinopril 20 mg daily  Pertinent Lab Values: . Serum creatinine 1.64>1.86, BUN 27, Potassium 4.0, Sodium 130, BNP 497.7  Vital Signs: . Weight: 236 lbs (admission weight: 247 lbs) . Blood pressure: 110-120/70s  . Heart rate: 60-70s  Medication Assistance / Insurance Benefits Check: Does the patient have prescription insurance?  Yes Type of insurance plan: UHC Medicare  Does the patient qualify for medication assistance through manufacturers or grants?   Pending . Eligible grants and/or patient assistance programs: pending . Medication assistance applications in progress: none  . Medication assistance applications approved: none Approved medication assistance renewals will be completed by: pending  Outpatient Pharmacy:  Prior to admission outpatient pharmacy: [facility] Is the patient willing to use [facility] at discharge? Yes Is the patient willing to transition their outpatient pharmacy to utilize a [facility] outpatient pharmacy?   Pending    Assessment: 1. Acute systolic CHF (EF 16-07%), due to ICM. NYHA class II symptoms. - Continue furosemide 40 mg BID - Continue  carvedilol 3.125 mg BID - Consider starting ARB or Entresto instead of lisinopril at follow up. SCr bump today post cath limited initiation prior to discharge. - Consider starting spironolactone and/or SGLT2i at follow up. SCr bump today post cath limited initiation prior to discharge   Plan: 1) Medication changes recommended at this time: - Discharge today - At follow up visit: start Entresto rather than reinitiating lisinopril if SCr allows  2) Patient assistance:  - Entresto copay $9.85 per month - Farxiga/Jardiance copay $9.85 per month   Kerby Nora, PharmD, BCPS Heart Failure Stewardship Pharmacist Phone [phone]

## 2021-01-16 NOTE — Care Management (Signed)
01-16-21 Case Manager received notification that patient will need a nebulizer machine for home use. Case Manager made the referral with [employer] and the Nebulizer will be delivered to the room prior to transition home. Family is transporting the patient home via private vehicle. Bethena Roys, RN,BSN Case Manager

## 2021-01-16 NOTE — Telephone Encounter (Signed)
Please see note from Dr. Shearon Stalls and schedule patient.

## 2021-01-16 NOTE — Discharge Summary (Signed)
Physician Discharge Summary  Patient ID: Darrell Simpson MRN: [id number] DOB/AGE: 20-Dec-1950 70 y.o.  Admit date: 01/11/2021 Discharge date: 01/16/2021  Primary Discharge Diagnosis: NSTEMI Heart failure with reduced ejection fraction Exertional dyspnea, post COVID fibrotic changes vs interstitial lung disease  Secondary Discharge Diagnosis: Hypertension Type 2 Diabetes Mellitus Mixed hyperlipidemia CKD stage 3   Hospital Course:   70 year old Caucasian male with hypertension, former >60 PY smoker (quit 2016), CKD, h/o squamous cell skin cancer, h/o COVID pneumonia in 11/2020 with residual fibrotic changes, admitted with non-STEMI.  Diagnostic coronary angiogram on 01/12/2021 showed multivessel CAD with CTO LAD, severely calcified LCx/OM1 bifurcation stenosis, severe proximal RCA stenosis-likely culprit lesion.  CT surgery consult was sought.  Given patient's underlying lung issues, and poor targets for LAD, he was deemed not to be a surgical candidate.  Medical therapy is limited due to hypotension.  PCI attempt to complex RCA lesion was unsuccessful. I recommended optimization of medical management and consideration for LCx PCI outpatient. Will start [facility] outpatient, after stabilization of renal function. I expect sodium to improve with treatment of heart failure.  Patient has no recurrence of chest pain on discharge. He has stable exertional dyspnea and hypoxia. He was evaluated by pulmonomolgy and recommendation inhaled steroids and bronchodilators along with outpatient follow up. Supplemetal oxygen and nebulizer was arranged.    Discharge Exam: Blood pressure 108/66, pulse 62, temperature 97.7 F (36.5 C), temperature source Oral, resp. rate 18, height 6' (1.829 m), weight 107.4 kg, SpO2 95 %.   Physical Exam Vitals and nursing note reviewed.  Constitutional:      General: He is not in acute distress.    Appearance: He is well-developed.  HENT:     Head: Normocephalic and atraumatic.   Eyes:     Conjunctiva/sclera: Conjunctivae normal.     Pupils: Pupils are equal, round, and reactive to light.  Neck:     Vascular: No JVD.  Cardiovascular:     Rate and Rhythm: Normal rate and regular rhythm.     Pulses: Normal pulses and intact distal pulses.     Heart sounds: No murmur heard.   Pulmonary:     Effort: Pulmonary effort is normal.     Breath sounds: Normal breath sounds. No wheezing or rales.  Abdominal:     General: Bowel sounds are normal.     Palpations: Abdomen is soft.     Tenderness: There is no rebound.  Musculoskeletal:        General: No tenderness. Normal range of motion.     Right lower leg: No edema.     Left lower leg: No edema.  Lymphadenopathy:     Cervical: No cervical adenopathy.  Skin:    General: Skin is warm and dry.  Neurological:     Mental Status: He is alert and oriented to person, place, and time.     Cranial Nerves: No cranial nerve deficit.      Significant Diagnostic Studies:  EKG 01/16/2021: Sinus rhythm IVCD PVC  Coronary angiography 01/15/2021: Attempted angioplasty proximal RCA. Unsuccessful attempt. No complications.  Coronary angiography 3/4/202: LM: Distal calcific 50% stenosis LAD: Prox CTO with weak left-to-left collaterals from LCx/OM LCx: Severe calcification throughout the vessel         Prox calcific 80% stenosis         OM1 Ostial 60%, followed by prox 90% stenosis RCA: Prox calcific 90% tubular stenosis         Mid 40% stenosis  LVEDP 1  mmHg  Echocardiogram 01/12/2021: 1. Left ventricular ejection fraction, by estimation, is 40 to 45%. The  left ventricle has mildly decreased function. The left ventricle  demonstrates regional wall motion abnormalities (see scoring  diagram/findings for description). There is mild left  ventricular hypertrophy. Left ventricular diastolic parameters are  consistent with Grade I diastolic dysfunction (impaired relaxation).  2. Right ventricular systolic function  was not well visualized. The right  ventricular size is not well visualized.  3. The mitral valve is grossly normal. Mild mitral valve regurgitation.  4. The aortic valve is tricuspid. There is mild thickening of the aortic  valve. Aortic valve regurgitation is not visualized.     Labs:   Lab Results  Component Value Date   WBC 7.6 01/16/2021   HGB 13.6 01/16/2021   HCT 37.7 (L) 01/16/2021   MCV 91.3 01/16/2021   PLT 204 01/16/2021    Recent Labs  Lab 01/11/21 2030 01/12/21 0433 01/16/21 0253  NA 132*   < > 130*  K 4.5   < > 4.0  CL 100   < > 96*  CO2 23   < > 23  BUN 22   < > 27*  CREATININE 1.61*   < > 1.86*  CALCIUM 8.6*   < > 9.3  PROT 5.9*  --   --   BILITOT 1.0  --   --   ALKPHOS 63  --   --   ALT 37  --   --   AST 72*  --   --   GLUCOSE 114*   < > 100*   < > = values in this interval not displayed.    Lipid Panel     Component Value Date/Time   CHOL 179 01/11/2021 2030   TRIG 107 01/11/2021 2030   HDL 49 01/11/2021 2030   CHOLHDL 3.7 01/11/2021 2030   VLDL 21 01/11/2021 2030   LDLCALC 109 (H) 01/11/2021 2030    BNP (last 3 results) Recent Labs    01/13/21 0524 01/14/21 0236 01/15/21 0241  BNP 237.1* 109.4* 148.0*    HEMOGLOBIN A1C Lab Results  Component Value Date   HGBA1C 5.3 01/11/2021   MPG 105.41 01/11/2021    Cardiac Panel (last 3 results) No results for input(s): CKTOTAL, CKMB, TROPONINI, RELINDX in the last 8760 hours.  No results found for: CKTOTAL, CKMB, CKMBINDEX, TROPONINI   TSH No results for input(s): TSH in the last 8760 hours.  Radiology: DG Chest 1 View  Result Date: 01/11/2021 CLINICAL DATA:  Chest pain EXAM: CHEST  1 VIEW COMPARISON:  January 10, 2021 FINDINGS: The heart size and mediastinal contours are mildly enlarged. There is prominence of the central pulmonary vasculature. Patchy airspace opacity is seen at the left lung base. Mildly increased interstitial markings seen at the right lung base. The visualized  skeletal structures are unremarkable. IMPRESSION: Bilateral interstitial and patchy airspace opacities at the lung bases which could be due to multifocal pneumonia or chronic lung changes. Mild pulmonary vascular congestion. Electronically Signed   By: Prudencio Pair M.D.   On: 01/11/2021 19:31   CARDIAC CATHETERIZATION  Result Date: 04/14/3328  LV end diastolic pressure is low.  Attempted angioplasty proximal RCA. Unsuccessful attempt. No complications. Nigel Mormon, MD Pager: [phone] Office: [phone]  CARDIAC CATHETERIZATION  Result Date: 01/12/2021 LM: Distal calcific 50% stenosis LAD: Prox CTO with weak left-to-left collaterals from LCx/OM LCx: Severe calcification throughout the vessel         Prox calcific  80% stenosis         OM1 Ostial 60%, followed by prox 90% stenosis RCA: Prox calcific 90% tubular stenosis         Mid 40% stenosis LVEDP 1 mmHg Severe calcific multivessel CAD Patient's IV NTG was held due to hypotension. However, it was resumed at 20 mcg/min due to chest pain. Chest pain improved. CT surgery consulted for consideration for CABG Nigel Mormon, MD Pager: [phone] Office: [phone]   DG Chest Port 1V same Day  Result Date: 01/13/2021 CLINICAL DATA:  Shortness of breath. EXAM: PORTABLE CHEST 1 VIEW COMPARISON:  January 11, 2021 FINDINGS: The cardiomediastinal silhouette is stable. No pneumothorax. Opacity again seen in the left base in the periphery of the right lower lung. No other interval changes. IMPRESSION: Bilateral pulmonary opacities as above could represent edema or atypical infection. Recommend clinical correlation and follow-up to resolution. Electronically Signed   By: Dorise Bullion III M.D   On: 01/13/2021 12:37   ECHOCARDIOGRAM COMPLETE  Result Date: 01/12/2021    ECHOCARDIOGRAM REPORT   Patient Name:   Darrell Simpson Date of Exam: 01/12/2021 Medical Rec #:  [id number]    Height:       72.0 in Accession #:    [id number]   Weight:       242.9 lb  Date of Birth:  1951-02-01     BSA:          2.314 m Patient Age:    62 years     BP:           92/55 mmHg Patient Gender: M            HR:           71 bpm. Exam Location:  Inpatient Procedure: 2D Echo, Cardiac Doppler, Color Doppler and Intracardiac            Opacification Agent Indications:     NSTEMI  History:         Patient has no prior history of Echocardiogram examinations.                  Risk Factors:Hypertension and Former Smoker.  Sonographer:     Clayton Lefort RDCS (AE) Referring Phys:  [id number] [facility] Jocelyne Reinertsen Diagnosing Phys: Vernell Leep MD  Sonographer Comments: Technically difficult study due to poor echo windows and patient is morbidly obese. CKD. IMPRESSIONS  1. Left ventricular ejection fraction, by estimation, is 40 to 45%. The left ventricle has mildly decreased function. The left ventricle demonstrates regional wall motion abnormalities (see scoring diagram/findings for description). There is mild left ventricular hypertrophy. Left ventricular diastolic parameters are consistent with Grade I diastolic dysfunction (impaired relaxation).  2. Right ventricular systolic function was not well visualized. The right ventricular size is not well visualized.  3. The mitral valve is grossly normal. Mild mitral valve regurgitation.  4. The aortic valve is tricuspid. There is mild thickening of the aortic valve. Aortic valve regurgitation is not visualized. FINDINGS  Left Ventricle: Left ventricular ejection fraction, by estimation, is 40 to 45%. The left ventricle has mildly decreased function. The left ventricle demonstrates regional wall motion abnormalities. Definity contrast agent was given IV to delineate the left ventricular endocardial borders. The left ventricular internal cavity size was small. There is mild left ventricular hypertrophy. Left ventricular diastolic parameters are consistent with Grade I diastolic dysfunction (impaired relaxation).  LV Wall Scoring: Distal LAD territory  severe hypokinesis. Right Ventricle: The right ventricular size  is not well visualized. Right vetricular wall thickness was not well visualized. Right ventricular systolic function was not well visualized. Left Atrium: Left atrial size was normal in size. Right Atrium: Right atrial size was normal in size. Pericardium: There is no evidence of pericardial effusion. Mitral Valve: The mitral valve is grossly normal. Mild mitral valve regurgitation. Tricuspid Valve: The tricuspid valve is not well visualized. Tricuspid valve regurgitation is trivial. Aortic Valve: The aortic valve is tricuspid. There is mild thickening of the aortic valve. Aortic valve regurgitation is not visualized. Aortic valve mean gradient measures 4.0 mmHg. Aortic valve peak gradient measures 7.3 mmHg. Aortic valve area, by VTI  measures 2.60 cm. Pulmonic Valve: The pulmonic valve was grossly normal. Pulmonic valve regurgitation is not visualized. Aorta: The aortic root and ascending aorta are structurally normal, with no evidence of dilitation. IAS/Shunts: No atrial level shunt detected by color flow Doppler.  LEFT VENTRICLE PLAX 2D LVIDd:         5.30 cm  Diastology LVIDs:         3.60 cm  LV e' medial:    4.05 cm/s LV PW:         1.50 cm  LV E/e' medial:  10.9 LV IVS:        1.90 cm  LV e' lateral:   5.92 cm/s LVOT diam:     2.00 cm  LV E/e' lateral: 7.4 LV SV:         56 LV SV Index:   24 LVOT Area:     3.14 cm  IVC IVC diam: 2.00 cm LEFT ATRIUM             Index LA diam:        4.30 cm 1.86 cm/m LA Vol (A2C):   37.7 ml 16.29 ml/m LA Vol (A4C):   67.3 ml 29.08 ml/m LA Biplane Vol: 52.6 ml 22.73 ml/m  AORTIC VALVE AV Area (Vmax):    2.35 cm AV Area (Vmean):   2.41 cm AV Area (VTI):     2.60 cm AV Vmax:           135.00 cm/s AV Vmean:          94.600 cm/s AV VTI:            0.215 m AV Peak Grad:      7.3 mmHg AV Mean Grad:      4.0 mmHg LVOT Vmax:         101.00 cm/s LVOT Vmean:        72.600 cm/s LVOT VTI:          0.178 m LVOT/AV VTI  ratio: 0.83  AORTA Ao Root diam: 3.50 cm Ao Asc diam:  3.70 cm MITRAL VALVE MV Area (PHT): 2.09 cm    SHUNTS MV Decel Time: 363 msec    Systemic VTI:  0.18 m MV E velocity: 44.10 cm/s  Systemic Diam: 2.00 cm MV A velocity: 78.30 cm/s MV E/A ratio:  0.56 Zahria Ding MD Electronically signed by Vernell Leep MD Signature Date/Time: 01/12/2021/3:22:27 PM    Final       FOLLOW UP PLANS AND APPOINTMENTS Discharge Instructions    (HEART FAILURE PATIENTS) Call MD:  Anytime you have any of the following symptoms: 1) 3 pound weight gain in 24 hours or 5 pounds in 1 week 2) shortness of breath, with or without a dry hacking cough 3) swelling in the hands, feet or stomach 4) if you have to sleep  on extra pillows at night in order to breathe.   Complete by: As directed    AMB Referral to Cardiac Rehabilitation - Phase II   Complete by: As directed    Diagnosis: NSTEMI   After initial evaluation and assessments completed: Virtual Based Care may be provided alone or in conjunction with Phase 2 Cardiac Rehab based on patient barriers.: Yes   Diet - low sodium heart healthy   Complete by: As directed    Increase activity slowly   Complete by: As directed      Allergies as of 01/16/2021      Reactions   Penicillins Rash   Sulfa Antibiotics Rash      Medication List    STOP taking these medications   ciprofloxacin 500 MG tablet Commonly known as: CIPRO   lisinopril 20 MG tablet Commonly known as: ZESTRIL   omeprazole 20 MG capsule Commonly known as: PRILOSEC   predniSONE 10 MG tablet Commonly known as: DELTASONE     TAKE these medications   albuterol 108 (90 Base) MCG/ACT inhaler Commonly known as: VENTOLIN HFA Inhale 2 puffs into the lungs every 4 (four) hours as needed for wheezing or shortness of breath.   aspirin 81 MG EC tablet Take 1 tablet (81 mg total) by mouth daily. Swallow whole. Notes to patient: Blood thinner Prevents clotting in heart arteries   atorvastatin 80  MG tablet Commonly known as: LIPITOR Take 1 tablet (80 mg total) by mouth daily. Notes to patient: Cholesterol    carvedilol 6.25 MG tablet Commonly known as: COREG Take 0.5 tablets (3.125 mg total) by mouth 2 (two) times daily. What changed: how much to take Notes to patient: Decreases work of the heart Lowers blood pressure and heart rate DOSE CHANGE decreased dose from 6.25 mg to 3.125 mg   doxazosin 2 MG tablet Commonly known as: CARDURA Take 0.5 tablets (1 mg total) by mouth daily. What changed:   how much to take  when to take this Notes to patient: Lowers blood pressure  Treats urinary retention  DOSE CHANGE decreased dose from 2 mg to 1 mg   furosemide 40 MG tablet Commonly known as: LASIX Take 1 tablet (40 mg total) by mouth daily. What changed:   medication strength  how much to take  when to take this Notes to patient: Fluid medication  CHANGE FROM 20 MG 3 TIMES A DAY TO 40 MG ONCE A DAY   HYDROcodone-acetaminophen 10-325 MG tablet Commonly known as: NORCO Take 1 tablet by mouth every 4 (four) hours as needed for moderate pain.   ipratropium-albuterol 0.5-2.5 (3) MG/3ML Soln Commonly known as: DUONEB Take 3 mLs by nebulization 4 (four) times daily.   methocarbamol 500 MG tablet Commonly known as: ROBAXIN Take 500 mg by mouth every 6 (six) hours as needed for muscle spasms.   mometasone-formoterol 100-5 MCG/ACT Aero Commonly known as: DULERA Inhale 2 puffs into the lungs 2 (two) times daily.   nitroGLYCERIN 0.4 MG SL tablet Commonly known as: NITROSTAT Place 1 tablet (0.4 mg total) under the tongue every 5 (five) minutes x 3 doses as needed for chest pain.   pantoprazole 40 MG tablet Commonly known as: PROTONIX Take 1 tablet (40 mg total) by mouth daily. Start taking on: January 17, 2021 Notes to patient: Prevents stomach ulcers Decreases acid production in stomach   ranolazine 1000 MG SR tablet Commonly known as: RANEXA Take 1 tablet (1,000  mg total) by mouth 2 (two) times daily.  Notes to patient: Decreases chest pain    ticagrelor 90 MG Tabs tablet Commonly known as: BRILINTA Take 1 tablet (90 mg total) by mouth 2 (two) times daily. Notes to patient: Blood thinner Prevents clotting in heart arteries   Vitamin B 12 500 MCG Tabs Take by mouth as needed. Pt takes 1-2 x a month Notes to patient: Supplement    Vitamin D (Ergocalciferol) 1.25 MG (50000 UNIT) Caps capsule Commonly known as: DRISDOL Take 50,000 Units by mouth once a week. Notes to patient: Supplement             Durable Medical Equipment  (From admission, onward)         Start     Ordered   01/16/21 1153  For home use only DME Nebulizer machine  Once       Comments: Recent covid pneumonia  Question Answer Comment  Patient needs a nebulizer to treat with the following condition Pneumonia   Length of Need Lifetime      01/16/21 1154   01/14/21 1431  For home use only DME oxygen  Once       Question Answer Comment  Length of Need Lifetime   Mode or (Route) Nasal cannula   Liters per Minute 2   Frequency Continuous (stationary and portable oxygen unit needed)   Oxygen conserving device Yes   Oxygen delivery system Gas      01/14/21 1431          Follow-up Information    Nigel Mormon, MD Follow up on 01/23/2021.   Specialties: Cardiology, Radiology Why: 3 PM Contact information: [place] [address] [place] [facility] Follow up.   Why: home 02 arranged- transport tank to be delivered to room prior to discharge Nebulizer Machine to be delivered to the room  Contact information: [address] [place] [address] [phone]                 Nigel Mormon, MD Pager: [phone] Office: [phone]

## 2021-01-16 NOTE — Telephone Encounter (Signed)
Called and spoke with patient's wife, Freda Munro, listed on the DPR, regarding hospital f/u appointment, covid test and PFT.  Appointments made.  Nothing further needed.

## 2021-01-16 NOTE — Progress Notes (Addendum)
CARDIAC REHAB PHASE I   PRE:  Rate/Rhythm: 70 SR with PVCs    BP: sitting 111/74    SaO2: 93 2L  MODE:  Ambulation: 75 ft in room   POST:  Rate/Rhythm: 79 SR    BP: sitting 118/70     SaO2: 91 2L  Ambulated in room 2 "laps", equal to 75 ft. To recliner. SaO2 90-91 2L, 89 2L after sitting. Sts his SOB is significant with any activity which is limiting. Long discussion of slowly increasing activity, listening to his body, pursed lip breathing, IS and flutter valve. Pt receptive. Discussed MI, NTG, Brilinta importance, restriction, diet, and CRPII. Pt is n/a for CRPII due to his extent of disease and poor activity tolerance. Could refer after revascularization or with Dr. Ardelle Park guideance. Gave pt walking guidelines starting at 1 purposeful minute 3-5 times a day. He voiced understanding, appreciative.  [phone]   Show Low, ACSM 01/16/2021 10:38 AM

## 2021-01-16 NOTE — Care Management Important Message (Signed)
Important Message  Patient Details  Name: Darrell Simpson MRN: [id number] Date of Birth: 05-Feb-1951   Medicare Important Message Given:  Yes     Shelda Altes 01/16/2021, 12:23 PM

## 2021-01-17 ENCOUNTER — Telehealth: Payer: Self-pay

## 2021-01-17 NOTE — Telephone Encounter (Signed)
-----   Message from Nigel Mormon, MD sent at 01/16/2021  5:02 PM EST ----- Regarding: [facility] Discharge follow up: TOC: Needed Follow up appt: 3/15 Discharge diagnosis: NSTEMI Discharge date: 01/16/2021  Thanks MJP

## 2021-01-23 ENCOUNTER — Encounter: Payer: Self-pay | Admitting: Cardiology

## 2021-01-23 ENCOUNTER — Other Ambulatory Visit: Payer: Self-pay

## 2021-01-23 ENCOUNTER — Ambulatory Visit: Payer: Medicare Other | Admitting: Cardiology

## 2021-01-23 VITALS — BP 135/75 | HR 69 | Temp 97.8°F | Resp 16 | Ht 72.0 in | Wt 245.0 lb

## 2021-01-23 DIAGNOSIS — I5020 Unspecified systolic (congestive) heart failure: Secondary | ICD-10-CM | POA: Insufficient documentation

## 2021-01-23 DIAGNOSIS — I25118 Atherosclerotic heart disease of native coronary artery with other forms of angina pectoris: Secondary | ICD-10-CM

## 2021-01-23 DIAGNOSIS — I252 Old myocardial infarction: Secondary | ICD-10-CM | POA: Insufficient documentation

## 2021-01-23 MED ORDER — CARVEDILOL 6.25 MG PO TABS: 6.2500 mg | ORAL_TABLET | Freq: Two times a day (BID) | ORAL | 1 refills | Status: DC

## 2021-01-23 NOTE — Progress Notes (Signed)
Follow up visit  Subjective:   Darrell Simpson, male    DOB: 1951/02/23, 70 y.o.   MRN: [id number]   HPI  Chief Complaint  Patient presents with  . Non-ST elevation (NSTEMI) myocardial infarction ([place])  . Transitions Of Care  . Shortness of Breath    70 year old Caucasian male with hypertension, former >60 PY smoker (quit 2016), CKD, h/o squamous cell skin cancer, h/o COVID pneumonia in 11/2020 with residual fibrotic changes, admitted with non-STEMI. Diagnostic coronary angiogram on 01/12/2021 showed multivessel CAD with CTO LAD, severely calcified LCx/OM1 bifurcation stenosis, severe proximal RCA stenosis-likely culprit lesion. CT surgery consult was sought. Given patient's underlying lung issues, and poor targets for LAD, he was deemed not to be a surgical candidate. Medical therapy is limited due to hypotension. PCI attempt to complex RCA lesion was unsuccessful. I recommended optimization of medical management and consideration for LCx PCI outpatient, possibly starting [facility] outpatient, after stabilization of renal function. He was evaluated by pulmonomolgy and recommendation inhaled steroids and bronchodilators along with outpatient follow up. Supplemetal oxygen and nebulizer was arranged.   Patient is here for [facility] visit. He has had a few episodes of retrosternal sharp pain, lasting for a few min at rest. He continues to have exertional dyspnea. He feels "skipped beat" every now and then., He has appt with pulmonology in April 2022.    Current Outpatient Medications on File Prior to Visit  Medication Sig Dispense Refill  . albuterol (VENTOLIN HFA) 108 (90 Base) MCG/ACT inhaler Inhale 2 puffs into the lungs every 4 (four) hours as needed for wheezing or shortness of breath.    Marland Kitchen aspirin EC 81 MG EC tablet Take 1 tablet (81 mg total) by mouth daily. Swallow whole. 30 tablet 11  . atorvastatin (LIPITOR) 80 MG tablet Take 1 tablet (80 mg total) by mouth daily. 30 tablet 1  . carvedilol  (COREG) 6.25 MG tablet Take 0.5 tablets (3.125 mg total) by mouth 2 (two) times daily. 60 tablet 1  . Cyanocobalamin (VITAMIN B 12) 500 MCG TABS Take by mouth as needed. Pt takes 1-2 x a month    . doxazosin (CARDURA) 2 MG tablet Take 0.5 tablets (1 mg total) by mouth daily. 1 tablet 0  . furosemide (LASIX) 40 MG tablet Take 1 tablet (40 mg total) by mouth daily. 30 tablet 1  . HYDROcodone-acetaminophen (NORCO) 10-325 MG tablet Take 1 tablet by mouth every 4 (four) hours as needed for moderate pain.    Marland Kitchen ipratropium-albuterol (DUONEB) 0.5-2.5 (3) MG/3ML SOLN Take 3 mLs by nebulization 4 (four) times daily. 360 mL 1  . methocarbamol (ROBAXIN) 500 MG tablet Take 500 mg by mouth every 6 (six) hours as needed for muscle spasms.    . mometasone-formoterol (DULERA) 100-5 MCG/ACT AERO Inhale 2 puffs into the lungs 2 (two) times daily. 13 g 1  . nitroGLYCERIN (NITROSTAT) 0.4 MG SL tablet Place 1 tablet (0.4 mg total) under the tongue every 5 (five) minutes x 3 doses as needed for chest pain. 30 tablet 1  . pantoprazole (PROTONIX) 40 MG tablet Take 1 tablet (40 mg total) by mouth daily. 30 tablet 1  . ranolazine (RANEXA) 1000 MG SR tablet Take 1 tablet (1,000 mg total) by mouth 2 (two) times daily. 60 tablet 1  . ticagrelor (BRILINTA) 90 MG TABS tablet Take 1 tablet (90 mg total) by mouth 2 (two) times daily. 60 tablet 1  . Vitamin D, Ergocalciferol, (DRISDOL) 1.25 MG (50000 UNIT) CAPS capsule Take  50,000 Units by mouth once a week.     No current facility-administered medications on file prior to visit.    Cardiovascular & other pertient studies:  EKG 01/16/2021: Sinus rhythm IVCD PVC  Coronary intervention 01/15/2021: Attempted angioplasty proximal RCA. Unsuccessful attempt. No complications.   Coronary angiography 01/12/2021: LM: Distal calcific 50% stenosis LAD: Prox CTO with weak left-to-left collaterals from LCx/OM LCx: Severe calcification throughout the vessel         Prox calcific 80%  stenosis         OM1 Ostial 60%, followed by prox 90% stenosis RCA: Prox calcific 90% tubular stenosis         Mid 40% stenosis  LVEDP 1 mmHg  Severe calcific multivessel CAD Patient's IV NTG was held due to hypotension. However, it was resumed at 20 mcg/min due to chest pain. Chest pain improved.   CT surgery consulted for consideration for CABG  Echocardiogram 01/12/2021: 1. Left ventricular ejection fraction, by estimation, is 40 to 45%. The  left ventricle has mildly decreased function. The left ventricle  demonstrates regional wall motion abnormalities (see scoring  diagram/findings for description). There is mild left  ventricular hypertrophy. Left ventricular diastolic parameters are  consistent with Grade I diastolic dysfunction (impaired relaxation).  2. Right ventricular systolic function was not well visualized. The right  ventricular size is not well visualized.  3. The mitral valve is grossly normal. Mild mitral valve regurgitation.  4. The aortic valve is tricuspid. There is mild thickening of the aortic  valve. Aortic valve regurgitation is not visualized.   Recent labs: 01/16/2021: Glucose 100, BUN/Cr 27/1.86. EGFR 39. Na/K 130/4.0. Albumin 3.0. AST 72. Rest of the CMP normal H/H 13/37. MCV 91. Platelets 204 HbA1C 5.3% Chol 179, TG 107, HDL 49, LDL 109 TSH N/A    Review of Systems  Cardiovascular: Positive for chest pain and dyspnea on exertion. Negative for leg swelling, palpitations and syncope.         Vitals:   01/23/21 0955  BP: 135/75  Pulse: 69  Resp: 16  Temp: 97.8 F (36.6 C)  SpO2: 97%    Body mass index is 33.23 kg/m. Filed Weights   01/23/21 0955  Weight: 245 lb (111.1 kg)     Objective:   Physical Exam Vitals and nursing note reviewed.  Constitutional:      General: He is not in acute distress. Neck:     Vascular: No JVD.  Cardiovascular:     Rate and Rhythm: Normal rate and regular rhythm.     Heart sounds: Normal  heart sounds. No murmur heard.   Pulmonary:     Effort: Pulmonary effort is normal.     Breath sounds: Normal breath sounds. No wheezing or rales.  Musculoskeletal:     Right lower leg: No edema.     Left lower leg: No edema.           Assessment & Recommendations:   70 year old Caucasian male with hypertension, multivessel CAD, former >60 PY smoker (quit 2016), CKD 3, h/o squamous cell skin cancer, h/o COVID pneumonia in 11/2020 with residual fibrotic changes  CAD with angina: Multivessel CAD with CTO LAD, severely calcified LCx/OM1 bifurcation stenosis, severe proximal RCA stenosis-likely culprit lesion. Not a CABG candidate. Unsuccessful RCA PCI attempt. Given his ongoing angina symptoms, recommend complex PCI with atherectomy to LCx/OM1. Discussed risks, benefits with the patient, including bleeding, infection, MI, coronary dissection, perforation, arrhythmia, CIN, death. Patient understands the risks involved and wants to  proceed. Prior to that, I will check BMP, He will need hydration to avoid CIN. I will hold off starting Entresto until after the PCI.   With his ongoing exertional dyspnea, he needs ongoing pulmonary workup and follow up. Should he need any lung biopsy, hypothetically speaking, I would recommend delaying at least by 6 months.  HFrEF: Clinically euvolumic.  Continue current medical therapy. I will hold off starting Entresto until after the PCI.   F/u after PCI.    Nigel Mormon, MD Pager: [phone] Office: [phone]

## 2021-01-23 NOTE — Addendum Note (Signed)
Addended by: Nigel Mormon on: 01/23/2021 01:48 PM   Modules accepted: Level of Service

## 2021-01-24 LAB — BASIC METABOLIC PANEL
BUN/Creatinine Ratio: 14 (ref 10–24)
BUN: 23 mg/dL (ref 8–27)
CO2: 21 mmol/L (ref 20–29)
Calcium: 9.6 mg/dL (ref 8.6–10.2)
Chloride: 92 mmol/L — ABNORMAL LOW (ref 96–106)
Creatinine, Ser: 1.7 mg/dL — ABNORMAL HIGH (ref 0.76–1.27)
Glucose: 77 mg/dL (ref 65–99)
Potassium: 4.2 mmol/L (ref 3.5–5.2)
Sodium: 131 mmol/L — ABNORMAL LOW (ref 134–144)
eGFR: 43 mL/min/{1.73_m2} — ABNORMAL LOW (ref >59)

## 2021-01-24 LAB — BRAIN NATRIURETIC PEPTIDE: BNP: 206.9 pg/mL — ABNORMAL HIGH (ref 0.0–100.0)

## 2021-01-27 ENCOUNTER — Inpatient Hospital Stay (HOSPITAL_COMMUNITY)
Admission: RE | Admit: 2021-01-27 | Discharge: 2021-01-27 | Disposition: A | Discharge status: Home / Self Care | Payer: Self-pay | Source: Ambulatory Visit

## 2021-01-27 NOTE — Progress Notes (Signed)
Covid positive 01/11/21. No need to retest at this time per anesthesia guidelines.

## 2021-01-29 DIAGNOSIS — I25118 Atherosclerotic heart disease of native coronary artery with other forms of angina pectoris: Secondary | ICD-10-CM | POA: Diagnosis present

## 2021-01-29 DIAGNOSIS — I2510 Atherosclerotic heart disease of native coronary artery without angina pectoris: Secondary | ICD-10-CM | POA: Diagnosis present

## 2021-01-30 ENCOUNTER — Ambulatory Visit (HOSPITAL_COMMUNITY)
Admission: RE | Admit: 2021-01-30 | Discharge: 2021-01-31 | Disposition: A | Discharge status: Home / Self Care | Payer: Medicare Other | Source: Ambulatory Visit | Attending: Cardiology | Admitting: Cardiology

## 2021-01-30 ENCOUNTER — Other Ambulatory Visit: Payer: Self-pay

## 2021-01-30 ENCOUNTER — Encounter (HOSPITAL_COMMUNITY)
Admission: RE | Disposition: A | Discharge status: Home / Self Care | Payer: Self-pay | Source: Ambulatory Visit | Attending: Cardiology

## 2021-01-30 DIAGNOSIS — E1122 Type 2 diabetes mellitus with diabetic chronic kidney disease: Secondary | ICD-10-CM | POA: Diagnosis not present

## 2021-01-30 DIAGNOSIS — N183 Chronic kidney disease, stage 3 unspecified: Secondary | ICD-10-CM | POA: Diagnosis not present

## 2021-01-30 DIAGNOSIS — I2510 Atherosclerotic heart disease of native coronary artery without angina pectoris: Secondary | ICD-10-CM | POA: Diagnosis present

## 2021-01-30 DIAGNOSIS — Z87891 Personal history of nicotine dependence: Secondary | ICD-10-CM | POA: Diagnosis not present

## 2021-01-30 DIAGNOSIS — Z880 Allergy status to penicillin: Secondary | ICD-10-CM | POA: Diagnosis not present

## 2021-01-30 DIAGNOSIS — Z7982 Long term (current) use of aspirin: Secondary | ICD-10-CM | POA: Diagnosis not present

## 2021-01-30 DIAGNOSIS — I5020 Unspecified systolic (congestive) heart failure: Secondary | ICD-10-CM | POA: Diagnosis not present

## 2021-01-30 DIAGNOSIS — Z79899 Other long term (current) drug therapy: Secondary | ICD-10-CM | POA: Diagnosis not present

## 2021-01-30 DIAGNOSIS — I25118 Atherosclerotic heart disease of native coronary artery with other forms of angina pectoris: Secondary | ICD-10-CM | POA: Diagnosis present

## 2021-01-30 DIAGNOSIS — Z882 Allergy status to sulfonamides status: Secondary | ICD-10-CM | POA: Insufficient documentation

## 2021-01-30 DIAGNOSIS — Z9861 Coronary angioplasty status: Secondary | ICD-10-CM

## 2021-01-30 DIAGNOSIS — Z8616 Personal history of COVID-19: Secondary | ICD-10-CM | POA: Diagnosis not present

## 2021-01-30 DIAGNOSIS — E782 Mixed hyperlipidemia: Secondary | ICD-10-CM | POA: Diagnosis not present

## 2021-01-30 DIAGNOSIS — I130 Hypertensive heart and chronic kidney disease with heart failure and stage 1 through stage 4 chronic kidney disease, or unspecified chronic kidney disease: Secondary | ICD-10-CM | POA: Diagnosis not present

## 2021-01-30 HISTORY — PX: CORONARY ULTRASOUND/IVUS: CATH118244

## 2021-01-30 HISTORY — PX: CORONARY STENT INTERVENTION: CATH118234

## 2021-01-30 HISTORY — PX: CORONARY ATHERECTOMY: CATH118238

## 2021-01-30 LAB — POCT ACTIVATED CLOTTING TIME: Activated Clotting Time: 696 seconds

## 2021-01-30 SURGERY — CORONARY STENT INTERVENTION
Anesthesia: LOCAL

## 2021-01-30 MED ORDER — SODIUM CHLORIDE 0.9% FLUSH: 3.0000 mL | INTRAVENOUS | Status: DC | PRN

## 2021-01-30 MED ORDER — NITROGLYCERIN 0.4 MG SL SUBL: 0.4000 mg | SUBLINGUAL_TABLET | SUBLINGUAL | Status: DC | PRN

## 2021-01-30 MED ORDER — FUROSEMIDE 40 MG PO TABS
40.0000 mg | ORAL_TABLET | Freq: Every day | ORAL | Status: DC
  Administered 2021-01-31: 40 mg via ORAL
  Filled 2021-01-30: qty 1

## 2021-01-30 MED ORDER — SODIUM CHLORIDE 0.9 % IV SOLN: INTRAVENOUS | Status: AC

## 2021-01-30 MED ORDER — HYDRALAZINE HCL 20 MG/ML IJ SOLN: 10.0000 mg | INTRAMUSCULAR | Status: AC | PRN

## 2021-01-30 MED ORDER — SODIUM CHLORIDE 0.9 % WEIGHT BASED INFUSION
3.0000 mL/kg/h | INTRAVENOUS | Status: AC
  Administered 2021-01-30: 3 mL/kg/h via INTRAVENOUS

## 2021-01-30 MED ORDER — PANTOPRAZOLE SODIUM 40 MG PO TBEC
40.0000 mg | DELAYED_RELEASE_TABLET | Freq: Every day | ORAL | Status: DC
  Administered 2021-01-31: 40 mg via ORAL
  Filled 2021-01-30: qty 1

## 2021-01-30 MED ORDER — TICAGRELOR 90 MG PO TABS
90.0000 mg | ORAL_TABLET | Freq: Two times a day (BID) | ORAL | Status: DC
  Administered 2021-01-30 – 2021-01-31 (×2): 90 mg via ORAL
  Filled 2021-01-30 (×2): qty 1

## 2021-01-30 MED ORDER — IOHEXOL 350 MG/ML SOLN
INTRAVENOUS | Status: AC
  Filled 2021-01-30: qty 1

## 2021-01-30 MED ORDER — ASPIRIN EC 81 MG PO TBEC
81.0000 mg | DELAYED_RELEASE_TABLET | Freq: Every day | ORAL | Status: DC
  Administered 2021-01-31: 81 mg via ORAL
  Filled 2021-01-30: qty 1

## 2021-01-30 MED ORDER — LABETALOL HCL 5 MG/ML IV SOLN: 10.0000 mg | INTRAVENOUS | Status: AC | PRN

## 2021-01-30 MED ORDER — FENTANYL CITRATE (PF) 100 MCG/2ML IJ SOLN
INTRAMUSCULAR | Status: DC | PRN
  Administered 2021-01-30 (×3): 50 ug via INTRAVENOUS

## 2021-01-30 MED ORDER — ASPIRIN 81 MG PO CHEW: 81.0000 mg | CHEWABLE_TABLET | ORAL | Status: DC

## 2021-01-30 MED ORDER — VIPERSLIDE LUBRICANT OPTIME
TOPICAL | Status: DC | PRN
  Administered 2021-01-30: 50 mL via SURGICAL_CAVITY

## 2021-01-30 MED ORDER — BIVALIRUDIN TRIFLUOROACETATE 250 MG IV SOLR
INTRAVENOUS | Status: AC
  Filled 2021-01-30: qty 250

## 2021-01-30 MED ORDER — IOHEXOL 350 MG/ML SOLN
INTRAVENOUS | Status: DC | PRN
  Administered 2021-01-30: 130 mL

## 2021-01-30 MED ORDER — SODIUM CHLORIDE 0.9 % WEIGHT BASED INFUSION: 1.0000 mL/kg/h | INTRAVENOUS | Status: DC

## 2021-01-30 MED ORDER — FENTANYL CITRATE (PF) 100 MCG/2ML IJ SOLN
INTRAMUSCULAR | Status: AC
  Filled 2021-01-30: qty 2

## 2021-01-30 MED ORDER — MOMETASONE FURO-FORMOTEROL FUM 100-5 MCG/ACT IN AERO
2.0000 | INHALATION_SPRAY | Freq: Two times a day (BID) | RESPIRATORY_TRACT | Status: DC
  Administered 2021-01-31: 2 via RESPIRATORY_TRACT
  Filled 2021-01-30 (×2): qty 8.8

## 2021-01-30 MED ORDER — NITROGLYCERIN 1 MG/10 ML FOR IR/CATH LAB
INTRA_ARTERIAL | Status: DC | PRN
  Administered 2021-01-30: 200 ug via INTRACORONARY

## 2021-01-30 MED ORDER — HEPARIN SODIUM (PORCINE) 1000 UNIT/ML IJ SOLN
INTRAMUSCULAR | Status: AC
  Filled 2021-01-30: qty 1

## 2021-01-30 MED ORDER — LIDOCAINE HCL (PF) 1 % IJ SOLN
INTRAMUSCULAR | Status: AC
  Filled 2021-01-30: qty 30

## 2021-01-30 MED ORDER — IPRATROPIUM-ALBUTEROL 0.5-2.5 (3) MG/3ML IN SOLN
3.0000 mL | Freq: Four times a day (QID) | RESPIRATORY_TRACT | Status: DC
  Administered 2021-01-30 – 2021-01-31 (×2): 3 mL via RESPIRATORY_TRACT
  Filled 2021-01-30 (×2): qty 3

## 2021-01-30 MED ORDER — HEPARIN (PORCINE) IN NACL 1000-0.9 UT/500ML-% IV SOLN
INTRAVENOUS | Status: DC | PRN
  Administered 2021-01-30 (×3): 500 mL

## 2021-01-30 MED ORDER — SODIUM CHLORIDE 0.9 % IV SOLN
250.0000 mL | INTRAVENOUS | Status: DC | PRN
Start: 2021-01-30 — End: 2021-01-30

## 2021-01-30 MED ORDER — ONDANSETRON HCL 4 MG/2ML IJ SOLN: 4.0000 mg | Freq: Four times a day (QID) | INTRAMUSCULAR | Status: DC | PRN

## 2021-01-30 MED ORDER — ATORVASTATIN CALCIUM 80 MG PO TABS
80.0000 mg | ORAL_TABLET | Freq: Every day | ORAL | Status: DC
  Administered 2021-01-31: 80 mg via ORAL
  Filled 2021-01-30: qty 1

## 2021-01-30 MED ORDER — IPRATROPIUM-ALBUTEROL 0.5-2.5 (3) MG/3ML IN SOLN: 3.0000 mL | Freq: Four times a day (QID) | RESPIRATORY_TRACT | Status: DC

## 2021-01-30 MED ORDER — ALBUTEROL SULFATE HFA 108 (90 BASE) MCG/ACT IN AERS
2.0000 | INHALATION_SPRAY | RESPIRATORY_TRACT | Status: DC | PRN
  Filled 2021-01-30: qty 6.7

## 2021-01-30 MED ORDER — METHOCARBAMOL 500 MG PO TABS: 500.0000 mg | ORAL_TABLET | Freq: Four times a day (QID) | ORAL | Status: DC | PRN

## 2021-01-30 MED ORDER — ACETAMINOPHEN 325 MG PO TABS: 650.0000 mg | ORAL_TABLET | ORAL | Status: DC | PRN

## 2021-01-30 MED ORDER — NITROGLYCERIN 1 MG/10 ML FOR IR/CATH LAB
INTRA_ARTERIAL | Status: AC
  Filled 2021-01-30: qty 10

## 2021-01-30 MED ORDER — LIDOCAINE HCL (PF) 1 % IJ SOLN
INTRAMUSCULAR | Status: DC | PRN
  Administered 2021-01-30: 10 mL

## 2021-01-30 MED ORDER — SODIUM CHLORIDE 0.9% FLUSH
3.0000 mL | Freq: Two times a day (BID) | INTRAVENOUS | Status: DC
  Administered 2021-01-31: 3 mL via INTRAVENOUS

## 2021-01-30 MED ORDER — HEPARIN (PORCINE) IN NACL 1000-0.9 UT/500ML-% IV SOLN
INTRAVENOUS | Status: AC
  Filled 2021-01-30: qty 1500

## 2021-01-30 MED ORDER — RANOLAZINE ER 500 MG PO TB12
1000.0000 mg | ORAL_TABLET | Freq: Two times a day (BID) | ORAL | Status: DC
  Administered 2021-01-30 – 2021-01-31 (×2): 1000 mg via ORAL
  Filled 2021-01-30 (×2): qty 2

## 2021-01-30 MED ORDER — SODIUM CHLORIDE 0.9 % IV SOLN: 250.0000 mL | INTRAVENOUS | Status: DC | PRN

## 2021-01-30 MED ORDER — BIVALIRUDIN BOLUS VIA INFUSION - CUPID
INTRAVENOUS | Status: DC | PRN
  Administered 2021-01-30: 81 mg via INTRAVENOUS

## 2021-01-30 MED ORDER — MIDAZOLAM HCL 2 MG/2ML IJ SOLN
INTRAMUSCULAR | Status: AC
  Filled 2021-01-30: qty 2

## 2021-01-30 MED ORDER — CARVEDILOL 6.25 MG PO TABS
6.2500 mg | ORAL_TABLET | Freq: Two times a day (BID) | ORAL | Status: DC
  Administered 2021-01-30 – 2021-01-31 (×2): 6.25 mg via ORAL
  Filled 2021-01-30 (×2): qty 1

## 2021-01-30 MED ORDER — SODIUM CHLORIDE 0.9 % IV SOLN
INTRAVENOUS | Status: DC | PRN
  Administered 2021-01-30 (×3): 1.75 mg/kg/h via INTRAVENOUS

## 2021-01-30 MED ORDER — MIDAZOLAM HCL 2 MG/2ML IJ SOLN
INTRAMUSCULAR | Status: DC | PRN
  Administered 2021-01-30 (×3): 1 mg via INTRAVENOUS

## 2021-01-30 MED ORDER — HYDROCODONE-ACETAMINOPHEN 10-325 MG PO TABS
1.0000 | ORAL_TABLET | ORAL | Status: DC | PRN
  Administered 2021-01-30 – 2021-01-31 (×3): 1 via ORAL
  Filled 2021-01-30 (×3): qty 1

## 2021-01-30 MED ORDER — SODIUM CHLORIDE 0.9% FLUSH: 3.0000 mL | Freq: Two times a day (BID) | INTRAVENOUS | Status: DC

## 2021-01-30 SURGICAL SUPPLY — 34 items
BALLN SAPPHIRE 1.0X8 (BALLOONS) ×2
BALLN SAPPHIRE 3.5X10 (BALLOONS) ×2
BALLN SAPPHIRE 4.0X15 (BALLOONS) ×2
BALLN SAPPHIRE ~~LOC~~ 3.5X15 (BALLOONS) ×2 IMPLANT
BALLN WOLVERINE 3.00X15 (BALLOONS) ×2
BALLN WOLVERINE 3.25X6 (BALLOONS) ×2
BALLOON SAPPHIRE 1.0X8 (BALLOONS) ×1 IMPLANT
BALLOON SAPPHIRE 3.5X10 (BALLOONS) ×1 IMPLANT
BALLOON SAPPHIRE 4.0X15 (BALLOONS) ×1 IMPLANT
BALLOON WOLVERINE 3.00X15 (BALLOONS) ×1 IMPLANT
BALLOON WOLVERINE 3.25X6 (BALLOONS) ×1 IMPLANT
CATH LAUNCHER 6FR EBU3.5 (CATHETERS) ×4 IMPLANT
CATH OPTICROSS HD (CATHETERS) ×4 IMPLANT
CATH TELEPORT (CATHETERS) ×2 IMPLANT
CATH TELESCOPE 6F GEC (CATHETERS) ×2 IMPLANT
CATH TRAPPER 6-8F (CATHETERS) ×2 IMPLANT
CROWN DIAMONDBACK CLASSIC 1.25 (BURR) ×2 IMPLANT
KIT ENCORE 26 ADVANTAGE (KITS) ×4 IMPLANT
KIT HEART LEFT (KITS) ×2 IMPLANT
KIT HEMO VALVE WATCHDOG (MISCELLANEOUS) ×2 IMPLANT
KIT MICROPUNCTURE NIT STIFF (SHEATH) ×2 IMPLANT
LUBRICANT VIPERSLIDE CORONARY (MISCELLANEOUS) ×2 IMPLANT
PACK CARDIAC CATHETERIZATION (CUSTOM PROCEDURE TRAY) ×2 IMPLANT
SHEATH PINNACLE 6F 10CM (SHEATH) ×2 IMPLANT
SLED PULL BACK IVUS (MISCELLANEOUS) ×4 IMPLANT
STENT RESOLUTE ONYX 3.5X34 (Permanent Stent) ×2 IMPLANT
TRANSDUCER W/STOPCOCK (MISCELLANEOUS) ×2 IMPLANT
TUBING CIL FLEX 10 FLL-RA (TUBING) ×2 IMPLANT
WIRE ASAHI PROWATER 180CM (WIRE) ×2 IMPLANT
WIRE COUGAR XT STRL 190CM (WIRE) ×2 IMPLANT
WIRE EMERALD 3MM-J .035X150CM (WIRE) ×2 IMPLANT
WIRE HI TORQ WHISPER MS 190CM (WIRE) ×2 IMPLANT
WIRE RUNTHROUGH .014X180CM (WIRE) ×2 IMPLANT
WIRE VIPERWIRE COR FLEX .012 (WIRE) ×2 IMPLANT

## 2021-01-30 NOTE — Interval H&P Note (Signed)
History and Physical Interval Note:  01/30/2021 12:44 PM  Darrell Simpson  has presented today for surgery, with the diagnosis of cad.  The various methods of treatment have been discussed with the patient and family. After consideration of risks, benefits and other options for treatment, the patient has consented to  Procedure(s): CORONARY STENT INTERVENTION (N/A) CORONARY ATHERECTOMY (N/A) as a surgical intervention.  The patient's history has been reviewed, patient examined, no change in status, stable for surgery.  I have reviewed the patient's chart and labs.  Questions were answered to the patient's satisfaction.    2016/2017 Appropriate Use Criteria for Coronary Revascularization Clinical Presentation: Diabetes Mellitus? Symptom Status? S/P CABG? Antianginal Therapy (# of long-acting drugs)? Results of Non-invasive testing? FFR/iFR results in all diseased vessels? Patient undergoing renal transplant? Patient undergoing percutaneous valve procedure (TAVR, MitraClip, Others)? Symptom Status:  Ischemic Symptoms  Non-invasive Testing:  High risk  If no or indeterminate stress test, FFR/iFR results in all diseased vessels:  N/A  Diabetes Mellitus:  No  S/P CABG:  No  Antianginal therapy (number of long-acting drugs):  >=2  Patient undergoing renal transplant:  No  Patient undergoing percutaneous valve procedure:  No    newline 1 Vessel Disease PCI CABG  No proximal LAD involvement, No proximal left dominant LCX involvement A (8); Indication 2 M (6); Indication 2   Proximal left dominant LCX involvement A (8); Indication 5 A (8); Indication 5   Proximal LAD involvement A (8); Indication 5 A (8); Indication 5   newline 2 Vessel Disease  No proximal LAD involvement A (8); Indication 8 A (7); Indication 8   Proximal LAD involvement A (8); Indication 11 A (8); Indication 11   newline 3 Vessel Disease  Low disease complexity (e.g., focal stenoses, SYNTAX <=22) A (8); Indication 17 A (8);  Indication 17   Intermediate or high disease complexity (e.g., SYNTAX >=23) M (6); Indication 21 A (9); Indication 21   newline Left Main Disease  Isolated LMCA disease: ostial or midshaft A (7); Indication 24 A (9); Indication 24   Isolated LMCA disease: bifurcation involvement M (6); Indication 25 A (9); Indication 25   LMCA ostial or midshaft, concurrent low disease burden multivessel disease (e.g., 1-2 additional focal stenoses, SYNTAX <=22) A (7); Indication 26 A (9); Indication 26   LMCA ostial or midshaft, concurrent intermediate or high disease burden multivessel disease (e.g., 1-2 additional bifurcation stenoses, long stenoses, SYNTAX >=23) M (4); Indication 27 A (9); Indication 27   LMCA bifurcation involvement, concurrent low disease burden multivessel disease (e.g., 1-2 additional focal stenoses, SYNTAX <=22) M (6); Indication 28 A (9); Indication 28   LMCA bifurcation involvement, concurrent intermediate or high disease burden multivessel disease (e.g., 1-2 additional bifurcation stenoses, long stenoses, SYNTAX >=23) R (3); Indication 29 A (9); Indication [place]

## 2021-01-30 NOTE — H&P (Signed)
OV 01/23/2021 copied for documentation    Follow up visit  Subjective:   Darrell Simpson, male    DOB: 1951/05/24, 70 y.o.   MRN: [id number]   HPI   CC: Afib  70 year old Caucasian male with hypertension, former >60 PY smoker (quit 2016), CKD, h/o squamous cell skin cancer, h/o COVID pneumonia in 11/2020 with residual fibrotic changes, admitted with non-STEMI. Diagnostic coronary angiogram on 01/12/2021 showed multivessel CAD with CTO LAD, severely calcified LCx/OM1 bifurcation stenosis, severe proximal RCA stenosis-likely culprit lesion. CT surgery consult was sought. Given patient's underlying lung issues, and poor targets for LAD, he was deemed not to be a surgical candidate. Medical therapy is limited due to hypotension. PCI attempt to complex RCA lesion was unsuccessful. I recommended optimization of medical management and consideration for LCx PCI outpatient, possibly starting [facility] outpatient, after stabilization of renal function. He was evaluated by pulmonomolgy and recommendation inhaled steroids and bronchodilators along with outpatient follow up. Supplemetal oxygen and nebulizer was arranged.   Patient is here for [facility] visit. He has had a few episodes of retrosternal sharp pain, lasting for a few min at rest. He continues to have exertional dyspnea. He feels "skipped beat" every now and then., He has appt with pulmonology in April 2022.    No current facility-administered medications on file prior to encounter.   Current Outpatient Medications on File Prior to Encounter  Medication Sig Dispense Refill  . albuterol (VENTOLIN HFA) 108 (90 Base) MCG/ACT inhaler Inhale 2 puffs into the lungs every 4 (four) hours as needed for wheezing or shortness of breath.    Marland Kitchen aspirin EC 81 MG EC tablet Take 1 tablet (81 mg total) by mouth daily. Swallow whole. 30 tablet 11  . atorvastatin (LIPITOR) 80 MG tablet Take 1 tablet (80 mg total) by mouth daily. 30 tablet 1  . carvedilol (COREG) 6.25  MG tablet Take 1 tablet (6.25 mg total) by mouth 2 (two) times daily. 60 tablet 1  . Cyanocobalamin (VITAMIN B 12) 500 MCG TABS Take by mouth as needed. Pt takes 1-2 x a month    . furosemide (LASIX) 40 MG tablet Take 1 tablet (40 mg total) by mouth daily. 30 tablet 1  . HYDROcodone-acetaminophen (NORCO) 10-325 MG tablet Take 1 tablet by mouth every 4 (four) hours as needed for moderate pain.    Marland Kitchen ipratropium-albuterol (DUONEB) 0.5-2.5 (3) MG/3ML SOLN Take 3 mLs by nebulization 4 (four) times daily. 360 mL 1  . mometasone-formoterol (DULERA) 100-5 MCG/ACT AERO Inhale 2 puffs into the lungs 2 (two) times daily. 13 g 1  . nitroGLYCERIN (NITROSTAT) 0.4 MG SL tablet Place 1 tablet (0.4 mg total) under the tongue every 5 (five) minutes x 3 doses as needed for chest pain. 30 tablet 1  . pantoprazole (PROTONIX) 40 MG tablet Take 1 tablet (40 mg total) by mouth daily. 30 tablet 1  . ranolazine (RANEXA) 1000 MG SR tablet Take 1 tablet (1,000 mg total) by mouth 2 (two) times daily. 60 tablet 1  . ticagrelor (BRILINTA) 90 MG TABS tablet Take 1 tablet (90 mg total) by mouth 2 (two) times daily. 60 tablet 1  . Vitamin D, Ergocalciferol, (DRISDOL) 1.25 MG (50000 UNIT) CAPS capsule Take 50,000 Units by mouth once a week.    . methocarbamol (ROBAXIN) 500 MG tablet Take 500 mg by mouth every 6 (six) hours as needed for muscle spasms.      Cardiovascular & other pertient studies:  EKG 01/16/2021: Sinus rhythm IVCD PVC  Coronary intervention 01/15/2021: Attempted angioplasty proximal RCA. Unsuccessful attempt. No complications.   Coronary angiography 01/12/2021: LM: Distal calcific 50% stenosis LAD: Prox CTO with weak left-to-left collaterals from LCx/OM LCx: Severe calcification throughout the vessel         Prox calcific 80% stenosis         OM1 Ostial 60%, followed by prox 90% stenosis RCA: Prox calcific 90% tubular stenosis         Mid 40% stenosis  LVEDP 1 mmHg  Severe calcific multivessel  CAD Patient's IV NTG was held due to hypotension. However, it was resumed at 20 mcg/min due to chest pain. Chest pain improved.   CT surgery consulted for consideration for CABG  Echocardiogram 01/12/2021: 1. Left ventricular ejection fraction, by estimation, is 40 to 45%. The  left ventricle has mildly decreased function. The left ventricle  demonstrates regional wall motion abnormalities (see scoring  diagram/findings for description). There is mild left  ventricular hypertrophy. Left ventricular diastolic parameters are  consistent with Grade I diastolic dysfunction (impaired relaxation).  2. Right ventricular systolic function was not well visualized. The right  ventricular size is not well visualized.  3. The mitral valve is grossly normal. Mild mitral valve regurgitation.  4. The aortic valve is tricuspid. There is mild thickening of the aortic  valve. Aortic valve regurgitation is not visualized.   Recent labs: 01/16/2021: Glucose 100, BUN/Cr 27/1.86. EGFR 39. Na/K 130/4.0. Albumin 3.0. AST 72. Rest of the CMP normal H/H 13/37. MCV 91. Platelets 204 HbA1C 5.3% Chol 179, TG 107, HDL 49, LDL 109 TSH N/A    Review of Systems  Cardiovascular: Positive for chest pain and dyspnea on exertion. Negative for leg swelling, palpitations and syncope.         Vitals:   01/30/21 0759  BP: 125/81  Pulse: 67  Temp: 98.2 F (36.8 C)  SpO2: 97%    Body mass index is 32.28 kg/m. Filed Weights   01/30/21 0759  Weight: 108 kg     Objective:   Physical Exam Vitals and nursing note reviewed.  Constitutional:      General: He is not in acute distress. Neck:     Vascular: No JVD.  Cardiovascular:     Rate and Rhythm: Normal rate and regular rhythm.     Heart sounds: Normal heart sounds. No murmur heard.   Pulmonary:     Effort: Pulmonary effort is normal.     Breath sounds: Normal breath sounds. No wheezing or rales.  Musculoskeletal:     Right lower leg: No edema.      Left lower leg: No edema.           Assessment & Recommendations:   70 year old Caucasian male with hypertension, multivessel CAD, former >60 PY smoker (quit 2016), CKD 3, h/o squamous cell skin cancer, h/o COVID pneumonia in 11/2020 with residual fibrotic changes  CAD with angina: Multivessel CAD with CTO LAD, severely calcified LCx/OM1 bifurcation stenosis, severe proximal RCA stenosis-likely culprit lesion. Not a CABG candidate. Unsuccessful RCA PCI attempt. Given his ongoing angina symptoms, recommend complex PCI with atherectomy to LCx/OM1. Discussed risks, benefits with the patient, including bleeding, infection, MI, coronary dissection, perforation, arrhythmia, CIN, death. Patient understands the risks involved and wants to proceed. Prior to that, I will check BMP, He will need hydration to avoid CIN. I will hold off starting Entresto until after the PCI.   With his ongoing exertional dyspnea, he needs ongoing pulmonary workup and follow up. Should  he need any lung biopsy, hypothetically speaking, I would recommend delaying at least by 6 months.  HFrEF: Clinically euvolumic.  Continue current medical therapy. I will hold off starting Entresto until after the PCI.   F/u after PCI.    Nigel Mormon, MD Pager: [phone] Office: [phone]

## 2021-01-31 ENCOUNTER — Encounter (HOSPITAL_COMMUNITY): Payer: Self-pay | Admitting: Cardiology

## 2021-01-31 DIAGNOSIS — I130 Hypertensive heart and chronic kidney disease with heart failure and stage 1 through stage 4 chronic kidney disease, or unspecified chronic kidney disease: Secondary | ICD-10-CM | POA: Diagnosis not present

## 2021-01-31 DIAGNOSIS — I25118 Atherosclerotic heart disease of native coronary artery with other forms of angina pectoris: Secondary | ICD-10-CM | POA: Diagnosis not present

## 2021-01-31 DIAGNOSIS — I5020 Unspecified systolic (congestive) heart failure: Secondary | ICD-10-CM | POA: Diagnosis not present

## 2021-01-31 DIAGNOSIS — N183 Chronic kidney disease, stage 3 unspecified: Secondary | ICD-10-CM | POA: Diagnosis not present

## 2021-01-31 LAB — BASIC METABOLIC PANEL
Anion gap: 6 (ref 5–15)
BUN: 23 mg/dL (ref 8–23)
CO2: 20 mmol/L — ABNORMAL LOW (ref 22–32)
Calcium: 8.2 mg/dL — ABNORMAL LOW (ref 8.9–10.3)
Chloride: 105 mmol/L (ref 98–111)
Creatinine, Ser: 1.39 mg/dL — ABNORMAL HIGH (ref 0.61–1.24)
GFR, Estimated: 55 mL/min — ABNORMAL LOW (ref >60)
Glucose, Bld: 85 mg/dL (ref 70–99)
Potassium: 4 mmol/L (ref 3.5–5.1)
Sodium: 131 mmol/L — ABNORMAL LOW (ref 135–145)

## 2021-01-31 LAB — CBC
HCT: 32.8 % — ABNORMAL LOW (ref 39.0–52.0)
Hemoglobin: 11.5 g/dL — ABNORMAL LOW (ref 13.0–17.0)
MCH: 33 pg (ref 26.0–34.0)
MCHC: 35.1 g/dL (ref 30.0–36.0)
MCV: 94 fL (ref 80.0–100.0)
Platelets: 177 10*3/uL (ref 150–400)
RBC: 3.49 MIL/uL — ABNORMAL LOW (ref 4.22–5.81)
RDW: 14.2 % (ref 11.5–15.5)
WBC: 8.8 10*3/uL (ref 4.0–10.5)
nRBC: 0 % (ref 0.0–0.2)

## 2021-01-31 NOTE — Discharge Summary (Signed)
Physician Discharge Summary  Patient ID: Darrell Simpson MRN: [id number] DOB/AGE: 02/27/1951 70 y.o.  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 Patient was deemed not to be a surgical candidate for CABG.  He had an unsuccessful attempt to PCI to RCA. Given recurrent angina symptoms on optimal medical therapy, is now here for complex coronary intervention to left circumflex/OM1 using atherectomy. Details below.   Patient had mild groin pain. No significant drop in hemoglobin.   There is TIMI III flow down LCx and OM. Patient is chest pain free with no EKG or hemodynamic changes. At this point, I decided to abort the procedure, as further attempts would likely increase risk of complications. Recommend outpatient follow up and consideration for repeat attempt in 4-6 weeks. Also important is follow up and management of his underlying interstitial lung disease. He conitnues to have hypoxia and exertional dyspnea with minnimal activity. Continue f/u w/pulmonology.   Discharge Exam: Blood pressure 114/65, pulse 63, temperature (!) 97.4 F (36.3 C), temperature source Oral, resp. rate 16, height 6' (1.829 m), weight 108 kg, SpO2 98 %.   Physical Exam Vitals and nursing note reviewed.  Constitutional:      General: He is not in acute distress.    Appearance: He is well-developed.  HENT:     Head: Normocephalic and atraumatic.  Eyes:     Conjunctiva/sclera:  Conjunctivae normal.     Pupils: Pupils are equal, round, and reactive to light.  Neck:     Vascular: No JVD.  Cardiovascular:     Rate and Rhythm: Normal rate and regular rhythm.     Pulses: Normal pulses and intact distal pulses.     Heart sounds: No murmur heard.   Pulmonary:     Effort: Pulmonary effort is normal.     Breath sounds: Normal breath sounds. No wheezing or rales.  Abdominal:     General: Bowel sounds are normal.     Palpations: Abdomen is soft.     Tenderness: There is no rebound.  Musculoskeletal:        General: No tenderness. Normal range of motion.     Left lower leg: No edema.  Lymphadenopathy:     Cervical: No cervical adenopathy.  Skin:    General: Skin is warm and dry.  Neurological:     Mental Status: He is alert and oriented to person, place, and time.     Cranial Nerves: No cranial nerve deficit.      Significant Diagnostic Studies:  EKG 01/31/2021: Sinus rhythm 57 bpm.  First-degree AV block. Left axis deviation. Anterolateral infarct, age indeterminate. No significant change compared to previous EKG.   Coronary intervention 01/31/2021: LM: Distal LM 40% stenosis LAD: Prox occlusion LCx: Severe calcification throughout the vessel Prox calcific 80% stenosis OM1 Ostial 60%, followed by prox 90% stenosis  Successful percutaneous coronary intervention OM1 Orbital atherectomy, PTCA and stent placement 3.5 X 34 mm Resolute Onyx drug-eluting stent Attempted percutaneous intervention LCx. Unable to advance stent. Non-flow limiting dissection in prox LCx  There is TIMI  III flow down LCx and OM. Patient is chest pain free with no EKG or hemodynamic changes. At this point, I decided to abort the procedure, as further attempts would likely increase risk of complications. Will monitor overnight, and consider repeat attempt in a few weeks.   Coronary intervention 01/15/2021: Attempted angioplasty proximal RCA. Unsuccessful  attempt. No complications.   Coronary angiography 01/12/2021: LM: Distal calcific 50% stenosis LAD: Prox CTO with weak left-to-left collaterals from LCx/OM LCx: Severe calcification throughout the vessel Prox calcific 80% stenosis OM1 Ostial 60%, followed by prox 90% stenosis RCA: Prox calcific 90% tubular stenosis Mid 40% stenosis  LVEDP 1 mmHg  Severe calcific multivessel CAD Patient's IV NTG was held due to hypotension. However, it was resumed at 20 mcg/min due to chest pain. Chest pain improved.   CT surgery consulted for consideration for CABG  Echocardiogram 01/12/2021: 1. Left ventricular ejection fraction, by estimation, is 40 to 45%. The  left ventricle has mildly decreased function. The left ventricle  demonstrates regional wall motion abnormalities (see scoring  diagram/findings for description). There is mild left  ventricular hypertrophy. Left ventricular diastolic parameters are  consistent with Grade I diastolic dysfunction (impaired relaxation).  2. Right ventricular systolic function was not well visualized. The right  ventricular size is not well visualized.  3. The mitral valve is grossly normal. Mild mitral valve regurgitation.  4. The aortic valve is tricuspid. There is mild thickening of the aortic  valve. Aortic valve regurgitation is not visualized.     Labs:   Lab Results  Component Value Date   WBC 8.8 01/31/2021   HGB 11.5 (L) 01/31/2021   HCT 32.8 (L) 01/31/2021   MCV 94.0 01/31/2021   PLT 177 01/31/2021    Recent Labs  Lab 01/31/21 0250  NA 131*  K 4.0  CL 105  CO2 20*  BUN 23  CREATININE 1.39*  CALCIUM 8.2*  GLUCOSE 85    Lipid Panel     Component Value Date/Time   CHOL 179 01/11/2021 2030   TRIG 107 01/11/2021 2030   HDL 49 01/11/2021 2030   CHOLHDL 3.7 01/11/2021 2030   VLDL 21 01/11/2021 2030   LDLCALC 109 (H) 01/11/2021 2030    BNP (last 3 results) Recent Labs    01/14/21 0236  01/15/21 0241 01/23/21 1103  BNP 109.4* 148.0* 206.9*    HEMOGLOBIN A1C Lab Results  Component Value Date   HGBA1C 5.3 01/11/2021   MPG 105.41 01/11/2021    Cardiac Panel (last 3 results) No results for input(s): CKTOTAL, CKMB, TROPONINI, RELINDX in the last 8760 hours.  No results found for: CKTOTAL, CKMB, CKMBINDEX, TROPONINI   TSH No results for input(s): TSH in the last 8760 hours.  Radiology: DG Chest 1 View  Result Date: 01/11/2021 CLINICAL DATA:  Chest pain EXAM: CHEST  1 VIEW COMPARISON:  January 10, 2021 FINDINGS: The heart size and mediastinal contours are mildly enlarged. There is prominence of the central pulmonary vasculature. Patchy airspace opacity is seen at the left lung base. Mildly increased interstitial markings seen at the right lung base. The visualized skeletal structures are unremarkable. IMPRESSION: Bilateral interstitial and patchy airspace opacities at the lung bases which could be due to multifocal pneumonia or chronic lung changes. Mild pulmonary vascular congestion. Electronically Signed   By: Prudencio Pair M.D.   On: 01/11/2021 19:31   DG Chest Port 1V same Day  Result Date: 01/13/2021 CLINICAL DATA:  Shortness of breath. EXAM: PORTABLE CHEST 1 VIEW COMPARISON:  January 11, 2021 FINDINGS: The cardiomediastinal silhouette is stable. No pneumothorax. Opacity again seen in the left base in the periphery of the right lower lung. No other interval changes. IMPRESSION: Bilateral pulmonary opacities as above could represent edema or atypical infection. Recommend clinical correlation and follow-up to resolution. Electronically Signed   By: Dorise Bullion III M.D   On: 01/13/2021 12:37     FOLLOW UP PLANS AND APPOINTMENTS Discharge Instructions    AMB Referral to Cardiac Rehabilitation - Phase II   Complete by: As directed    Diagnosis: NSTEMI   After initial evaluation and assessments completed: Virtual Based Care may be provided alone or in conjunction with Phase  2 Cardiac Rehab based on patient barriers.: Yes   Diet - low sodium heart healthy   Complete by: As directed    Increase activity slowly   Complete by: As directed      Allergies as of 01/31/2021      Reactions   Penicillins Rash   Sulfa Antibiotics Rash      Medication List    TAKE these medications   albuterol 108 (90 Base) MCG/ACT inhaler Commonly known as: VENTOLIN HFA Inhale 2 puffs into the lungs every 4 (four) hours as needed for wheezing or shortness of breath.   aspirin 81 MG EC tablet Take 1 tablet (81 mg total) by mouth daily. Swallow whole.   atorvastatin 80 MG tablet Commonly known as: LIPITOR Take 1 tablet (80 mg total) by mouth daily.   carvedilol 6.25 MG tablet Commonly known as: COREG Take 1 tablet (6.25 mg total) by mouth 2 (two) times daily.   furosemide 40 MG tablet Commonly known as: LASIX Take 1 tablet (40 mg total) by mouth daily.   HYDROcodone-acetaminophen 10-325 MG tablet Commonly known as: NORCO Take 1 tablet by mouth every 4 (four) hours as needed for moderate pain.   ipratropium-albuterol 0.5-2.5 (3) MG/3ML Soln Commonly known as: DUONEB Take 3 mLs by nebulization 4 (four) times daily.   methocarbamol 500 MG tablet Commonly known as: ROBAXIN Take 500 mg by mouth every 6 (six) hours as needed for muscle spasms.   mometasone-formoterol 100-5 MCG/ACT Aero Commonly known as: DULERA Inhale 2 puffs into the lungs 2 (two) times daily.   nitroGLYCERIN 0.4 MG SL tablet Commonly known as: NITROSTAT Place 1 tablet (0.4 mg total) under the tongue every 5 (five) minutes x 3 doses as needed for chest pain.   pantoprazole 40 MG tablet Commonly known as: PROTONIX Take 1 tablet (40 mg total) by mouth daily.   ranolazine 1000 MG SR tablet Commonly known as: RANEXA Take 1 tablet (1,000 mg total) by mouth 2 (two) times daily.   ticagrelor 90 MG Tabs tablet Commonly known as: BRILINTA Take 1 tablet (90 mg total) by mouth 2 (two) times daily.    Vitamin B 12 500 MCG Tabs Take by mouth as needed. Pt takes 1-2 x a month   Vitamin D (Ergocalciferol) 1.25 MG (50000 UNIT) Caps capsule Commonly known as: DRISDOL Take 50,000 Units by mouth once a week.       Follow-up Information    Nigel Mormon, MD Follow up on 02/07/2021.   Specialties: Cardiology, Radiology Why: 9:30 AM Contact information: [place] [phone] [phone]                 Nigel Mormon, MD Pager: [phone] Office: [phone]

## 2021-01-31 NOTE — Progress Notes (Addendum)
Patient is complaining of increased pain in the right groin area.  Patient states that he feels another hard area in his lower right groin. Groin area is also swollen.  Medicated patient per MD order and notified MD.  Vital signs are within normal limits.  Will continue to monitor.   Donah Driver, RN

## 2021-01-31 NOTE — Progress Notes (Signed)
CARDIAC REHAB PHASE I   PRE:  Rate/Rhythm: 65 SR    BP: sitting 114/73    SaO2: 96 RA  MODE:  Ambulation: to door and then recliner   POST:  Rate/Rhythm: 76 SR with PAC    BP: sitting 99/62     SaO2: 86 RA, up to 93 RA with rest  Pt immediately SOB with standing and walking to door. Tried to get him to stand and rest and walk more but pt felt he needed to sit quickly. SaO2 86 RA sitting, BP also registered low. He sts his SOB 8/10 and also felt dizzy when walking.    Reviewed Brilinta, restrictions, activity as tolerated, diet, NTG, and CRPII. Instructed pt to carry his pulse ox and regularly check saO2. It looks like he needs O2 with any mobility and I explained this to pt. He sts he was able to walk in to [employer] the other day. Will place referral for CRPII however he will need to wait for staged PCI. [id number]  [place], ACSM 01/31/2021 10:15 AM

## 2021-01-31 NOTE — Progress Notes (Signed)
Patient is complaining that he feels a hard, firm area in his right lower abdomen, just above his right groin.  Patient complains of pain in the right groin area at a level 8 on the 0-10 pain scale.  Medication given as ordered by MD.  MD notified.  Will continue to monitor.    Donah Driver, RN

## 2021-02-02 ENCOUNTER — Encounter (HOSPITAL_COMMUNITY): Payer: Self-pay | Admitting: Cardiology

## 2021-02-07 ENCOUNTER — Ambulatory Visit: Payer: Medicare Other | Admitting: Cardiology

## 2021-02-07 ENCOUNTER — Other Ambulatory Visit: Payer: Self-pay

## 2021-02-07 ENCOUNTER — Encounter: Payer: Self-pay | Admitting: Cardiology

## 2021-02-07 VITALS — BP 134/71 | HR 67 | Temp 97.7°F | Resp 17 | Ht 72.0 in | Wt 248.2 lb

## 2021-02-07 DIAGNOSIS — I25118 Atherosclerotic heart disease of native coronary artery with other forms of angina pectoris: Secondary | ICD-10-CM

## 2021-02-07 DIAGNOSIS — I252 Old myocardial infarction: Secondary | ICD-10-CM

## 2021-02-07 DIAGNOSIS — I5020 Unspecified systolic (congestive) heart failure: Secondary | ICD-10-CM

## 2021-02-07 MED ORDER — ENTRESTO 24-26 MG PO TABS
1.0000 | ORAL_TABLET | Freq: Two times a day (BID) | ORAL | 1 refills | Status: DC
Start: 2021-02-07 — End: 2021-03-19

## 2021-02-07 NOTE — Progress Notes (Signed)
Follow up visit  Subjective:   Darrell Simpson, male    DOB: 08/02/1951, 70 y.o.   MRN: [id number]   HPI  Chief Complaint  Patient presents with  . Follow-up    4 week  . psot procedure  . Coronary artery disease of native artery of native heart wi    70 year old Caucasian male with hypertension, multivessel CAD, deemed not CABG candidate, HFrEF, former >60 PY smoker, CKD stage 3a, h/o squamous cell skin cancer, h/o COVID pneumonia in 11/2020 with residual fibrotic changes, possible interstitial lung disease  Patient was hospitalized with non-STEMI in 01/2020. Diagnostic coronary angiogram showed multivessel CAD with CTO LAD, severely calcified LCx/OM1 bifurcation stenosis, severe proximal RCA stenosis-likely culprit lesion. CT surgery consult was sought. Given patient's underlying lung issues, and poor targets for LAD, he was deemed not to be a surgical candidate. Medical therapy is limited due to hypotension. PCI attempt to complex RCA lesion was unsuccessful. I recommended optimization of medical management and consideration for LCx PCI outpatient, possibly starting [facility] outpatient, after stabilization of renal function. He was evaluated by pulmonomolgy and recommendation inhaled steroids and bronchodilators along with outpatient follow up. Supplemetal oxygen and nebulizer was arranged.   Subsequently, patient underwent complex atherectomy and PCI attempted to mid LCx/OM (01/30/2021).  However successfully stented.  However, stent could not be delivered to LCx.  Due to small nonflow limiting dissection, further attempts of stent delivery were aborted.  Patient had intact TIMI-3 flow to the circumflex and no chest pain or EKG changes suggestive of ischemia.  Patient is here for follow-up visit.  He has noted significant improvement in his chest pain.  For example, previously, he could not walk in the grocery store without having chest pain.  However, now he is able to walk using a buggy,  without having any chest pain.  His exertional dyspnea persists.  On a separate note, patient had bruising in his right groin, along with pain.   Current Outpatient Medications on File Prior to Visit  Medication Sig Dispense Refill  . albuterol (VENTOLIN HFA) 108 (90 Base) MCG/ACT inhaler Inhale 2 puffs into the lungs every 4 (four) hours as needed for wheezing or shortness of breath.    Marland Kitchen aspirin EC 81 MG EC tablet Take 1 tablet (81 mg total) by mouth daily. Swallow whole. 30 tablet 11  . atorvastatin (LIPITOR) 80 MG tablet Take 1 tablet (80 mg total) by mouth daily. 30 tablet 1  . carvedilol (COREG) 6.25 MG tablet Take 1 tablet (6.25 mg total) by mouth 2 (two) times daily. 60 tablet 1  . Cyanocobalamin (VITAMIN B 12) 500 MCG TABS Take by mouth as needed. Pt takes 1-2 x a month    . furosemide (LASIX) 40 MG tablet Take 1 tablet (40 mg total) by mouth daily. 30 tablet 1  . HYDROcodone-acetaminophen (NORCO) 10-325 MG tablet Take 1 tablet by mouth every 4 (four) hours as needed for moderate pain.    Marland Kitchen ipratropium-albuterol (DUONEB) 0.5-2.5 (3) MG/3ML SOLN Take 3 mLs by nebulization 4 (four) times daily. 360 mL 1  . methocarbamol (ROBAXIN) 500 MG tablet Take 500 mg by mouth every 6 (six) hours as needed for muscle spasms.    . mometasone-formoterol (DULERA) 100-5 MCG/ACT AERO Inhale 2 puffs into the lungs 2 (two) times daily. 13 g 1  . nitroGLYCERIN (NITROSTAT) 0.4 MG SL tablet Place 1 tablet (0.4 mg total) under the tongue every 5 (five) minutes x 3 doses as needed for chest  pain. 30 tablet 1  . pantoprazole (PROTONIX) 40 MG tablet Take 1 tablet (40 mg total) by mouth daily. 30 tablet 1  . ranolazine (RANEXA) 1000 MG SR tablet Take 1 tablet (1,000 mg total) by mouth 2 (two) times daily. 60 tablet 1  . SYMBICORT 160-4.5 MCG/ACT inhaler Inhale 1 puff into the lungs as needed.    . ticagrelor (BRILINTA) 90 MG TABS tablet Take 1 tablet (90 mg total) by mouth 2 (two) times daily. 60 tablet 1  . Vitamin  D, Ergocalciferol, (DRISDOL) 1.25 MG (50000 UNIT) CAPS capsule Take 50,000 Units by mouth once a week.     No current facility-administered medications on file prior to visit.    Cardiovascular & other pertient studies:  EKG 02/07/2021:  Sinus rhythm 64 bpm First degree A-V block  Old anterior infarct IVCD Nonspecific T-abnormality Low voltage  Coronary intervention 01/30/2021: LM: Distal LM 40% stenosis LAD: Prox occlusion LCx: Severe calcification throughout the vessel Prox calcific 80% stenosis OM1 Ostial 60%, followed by prox 90% stenosis  Successful percutaneous coronary intervention OM1 Orbital atherectomy, PTCA and stent placement 3.5 X 34 mm Resolute Onyx drug-eluting stent Attempted percutaneous intervention LCx. Unable to advance stent. Non-flow limiting dissection in prox LCx   There is TIMI III flow down LCx and OM. Patient is chest pain free with no EKG or hemodynamic changes. At this point, I decided to abort the procedure, as further attempts would likely increase risk of complications. Will monitor overnight, and consider repeat attempt in a few weeks.    Coronary intervention 01/15/2021: Attempted angioplasty proximal RCA. Unsuccessful attempt. No complications.   Coronary angiography 01/12/2021: LM: Distal calcific 50% stenosis LAD: Prox CTO with weak left-to-left collaterals from LCx/OM LCx: Severe calcification throughout the vessel         Prox calcific 80% stenosis         OM1 Ostial 60%, followed by prox 90% stenosis RCA: Prox calcific 90% tubular stenosis         Mid 40% stenosis  LVEDP 1 mmHg  Severe calcific multivessel CAD Patient's IV NTG was held due to hypotension. However, it was resumed at 20 mcg/min due to chest pain. Chest pain improved.   CT surgery consulted for consideration for CABG  Echocardiogram 01/12/2021: 1. Left ventricular ejection fraction, by estimation, is 40 to 45%. The  left ventricle has mildly  decreased function. The left ventricle  demonstrates regional wall motion abnormalities (see scoring  diagram/findings for description). There is mild left  ventricular hypertrophy. Left ventricular diastolic parameters are  consistent with Grade I diastolic dysfunction (impaired relaxation).  2. Right ventricular systolic function was not well visualized. The right  ventricular size is not well visualized.  3. The mitral valve is grossly normal. Mild mitral valve regurgitation.  4. The aortic valve is tricuspid. There is mild thickening of the aortic  valve. Aortic valve regurgitation is not visualized.   Recent labs: 01/16/2021: Glucose 100, BUN/Cr 27/1.86. EGFR 39. Na/K 130/4.0. Albumin 3.0. AST 72. Rest of the CMP normal H/H 13/37. MCV 91. Platelets 204 HbA1C 5.3% Chol 179, TG 107, HDL 49, LDL 109 TSH N/A    Review of Systems  Cardiovascular: Positive for dyspnea on exertion. Negative for chest pain, leg swelling, palpitations and syncope.  Skin:       Right groin bruising          Vitals:   02/07/21 0922  BP: 134/71  Pulse: 67  Resp: 17  Temp: 97.7 F (36.5  C)  SpO2: 96%    Body mass index is 33.66 kg/m. Filed Weights   02/07/21 0922  Weight: 248 lb 3.2 oz (112.6 kg)     Objective:   Physical Exam Vitals and nursing note reviewed.  Constitutional:      General: He is not in acute distress. Neck:     Vascular: No JVD.  Cardiovascular:     Rate and Rhythm: Normal rate and regular rhythm.     Heart sounds: Normal heart sounds. No murmur heard.   Pulmonary:     Effort: Pulmonary effort is normal.     Breath sounds: Normal breath sounds. No wheezing or rales.  Musculoskeletal:     Right lower leg: No edema.     Left lower leg: No edema.  Skin:    Comments: Ecchymosis over right flank, right groin, and right scrotum.  Mild tenderness medial to access site.           Assessment & Recommendations:   70 year old Caucasian male with  hypertension, multivessel CAD, deemed not CABG candidate, HFrEF, former >60 PY smoker, CKD stage 3a, h/o squamous cell skin cancer, h/o COVID pneumonia in 11/2020 with residual fibrotic changes, possible interstitial lung disease  CAD: Multivessel CAD with LAD CTO.  Severe proximal RCA and distal LM/proximal LCx/OM1 disease (01/2021) Not a surgical candidate.  Unsuccessful revascularization attempt of proximal RCA (01/2021) Successful atherectomy and PCI to OM.  Unable to deliver stent to circumflex with nonflow limiting dissection. Resolution of anginal symptoms at this time.  Explained to the patient that in the short run, he will likely do well without any symptoms of angina.  He does have increased risk of restenosis in the long term due to residual stenosis in left circumflex and distal left main, as well as nonflow limiting dissection. At this time, recommend continue medical management. Continue dual antiplatelet therapy with aspirin and Brilinta till 01/2022. Continue Lipitor 80 mg daily, carvedilol 6.25 mg twice daily, Ranexa 1000 mg twice daily. I explained to the patient that his shortness of breath is likely multifactorial and more likely related to his pulmonary pathology.  He will continue follow-up with pulmonology. I will see him back in 4 weeks to further discuss risks and benefits of further revascularization attempt to distal left main/left circumflex.  HFrEF: Clinically euvolumic. NYHA class II-III Continue current medical therapy. Added Entresto 24-26 mg twice daily.  Encourage hydration.  Check BMP next week.   Right groin ecchymosis: No significant hematoma.  I expect these changes resolve over the next several days.    Nigel Mormon, MD Pager: [phone] Office: [phone]

## 2021-02-13 ENCOUNTER — Other Ambulatory Visit (HOSPITAL_COMMUNITY): Payer: Medicare Other

## 2021-02-14 ENCOUNTER — Inpatient Hospital Stay: Payer: Medicare Other | Admitting: Internal Medicine

## 2021-02-14 NOTE — Telephone Encounter (Signed)
From patient.

## 2021-02-28 ENCOUNTER — Ambulatory Visit: Payer: Medicare Other | Admitting: Cardiology

## 2021-03-08 ENCOUNTER — Other Ambulatory Visit: Payer: Self-pay | Admitting: Cardiology

## 2021-03-19 ENCOUNTER — Other Ambulatory Visit: Payer: Self-pay | Admitting: Cardiology

## 2021-03-19 DIAGNOSIS — I5020 Unspecified systolic (congestive) heart failure: Secondary | ICD-10-CM

## 2021-03-20 LAB — BASIC METABOLIC PANEL
BUN/Creatinine Ratio: 13 (ref 10–24)
BUN: 18 mg/dL (ref 8–27)
CO2: 21 mmol/L (ref 20–29)
Calcium: 9.2 mg/dL (ref 8.6–10.2)
Chloride: 99 mmol/L (ref 96–106)
Creatinine, Ser: 1.42 mg/dL — ABNORMAL HIGH (ref 0.76–1.27)
Glucose: 84 mg/dL (ref 65–99)
Potassium: 4 mmol/L (ref 3.5–5.2)
Sodium: 139 mmol/L (ref 134–144)
eGFR: 53 mL/min/{1.73_m2} — ABNORMAL LOW (ref >59)

## 2021-03-22 ENCOUNTER — Other Ambulatory Visit: Payer: Self-pay

## 2021-03-22 ENCOUNTER — Ambulatory Visit: Payer: Medicare Other | Admitting: Cardiology

## 2021-03-22 ENCOUNTER — Encounter: Payer: Self-pay | Admitting: Cardiology

## 2021-03-22 VITALS — BP 109/66 | HR 64 | Temp 98.6°F | Resp 17 | Ht 72.0 in | Wt 250.0 lb

## 2021-03-22 DIAGNOSIS — I5020 Unspecified systolic (congestive) heart failure: Secondary | ICD-10-CM

## 2021-03-22 DIAGNOSIS — I25118 Atherosclerotic heart disease of native coronary artery with other forms of angina pectoris: Secondary | ICD-10-CM

## 2021-03-22 DIAGNOSIS — I252 Old myocardial infarction: Secondary | ICD-10-CM

## 2021-03-22 NOTE — Progress Notes (Signed)
Follow up visit  Subjective:   Darrell Simpson, male    DOB: 26-Oct-1951, 70 y.o.   MRN: [id number]   HPI  Chief Complaint  Patient presents with  . Coronary Artery Disease  . HFrEF   . Follow-up    28 week    70 year old Caucasian male with hypertension, multivessel CAD, deemed not CABG candidate, HFrEF, former >60 PY smoker, CKD stage 3a, h/o squamous cell skin cancer, h/o COVID pneumonia in 11/2020 with residual fibrotic changes, possible interstitial lung disease  Patient was hospitalized with non-STEMI in 01/2020. Diagnostic coronary angiogram showed multivessel CAD with CTO LAD, severely calcified LCx/OM1 bifurcation stenosis, severe proximal RCA stenosis-likely culprit lesion. CT surgery consult was sought. Given patient's underlying lung issues, and poor targets for LAD, he was deemed not to be a surgical candidate. Medical therapy is limited due to hypotension. PCI attempt to complex RCA lesion was unsuccessful. I recommended optimization of medical management and consideration for LCx PCI outpatient, possibly starting [facility] outpatient, after stabilization of renal function. He was evaluated by pulmonomolgy and recommendation inhaled steroids and bronchodilators along with outpatient follow up. Supplemetal oxygen and nebulizer was arranged.   Subsequently, patient underwent complex atherectomy and PCI attempted to mid LCx/OM (01/30/2021).  However successfully stented.  However, stent could not be delivered to LCx.  Due to small nonflow limiting dissection, further attempts of stent delivery were aborted.  Patient had intact TIMI-3 flow to the circumflex and no chest pain or EKG changes suggestive of ischemia.  Patient is here for follow-up visit.  He has not had any chest pain. His exertional dyspnea has significantly improved, but not resolved. For example, he is not able to walk around in [place] without getting short of breath, something he could not do so before. However,  he still gets short of breath while walking in [place].    Current Outpatient Medications on File Prior to Visit  Medication Sig Dispense Refill  . albuterol (VENTOLIN HFA) 108 (90 Base) MCG/ACT inhaler Inhale 2 puffs into the lungs every 4 (four) hours as needed for wheezing or shortness of breath.    Marland Kitchen aspirin EC 81 MG EC tablet Take 1 tablet (81 mg total) by mouth daily. Swallow whole. 30 tablet 11  . atorvastatin (LIPITOR) 80 MG tablet TAKE 1 TABLET BY MOUTH ONCE DAILY 30 tablet 1  . BRILINTA 90 MG TABS tablet TAKE 1 TABLET BY MOUTH TWICE DAILY 60 tablet 1  . carvedilol (COREG) 6.25 MG tablet Take 1 tablet (6.25 mg total) by mouth 2 (two) times daily. 60 tablet 1  . Cyanocobalamin (VITAMIN B 12) 500 MCG TABS Take by mouth as needed. Pt takes 1-2 x a month    . ENTRESTO 24-26 MG TAKE 1 TABLET BY MOUTH 2 TIMES DAILY 60 tablet 6  . furosemide (LASIX) 40 MG tablet TAKE 1 TABLET BY MOUTH ONCE DAILY 30 tablet 1  . HYDROcodone-acetaminophen (NORCO) 10-325 MG tablet Take 1 tablet by mouth every 4 (four) hours as needed for moderate pain.    Marland Kitchen ipratropium-albuterol (DUONEB) 0.5-2.5 (3) MG/3ML SOLN Take 3 mLs by nebulization 4 (four) times daily. 360 mL 1  . methocarbamol (ROBAXIN) 500 MG tablet Take 500 mg by mouth every 6 (six) hours as needed for muscle spasms.    . mometasone-formoterol (DULERA) 100-5 MCG/ACT AERO Inhale 2 puffs into the lungs 2 (two) times daily. 13 g 1  . nitroGLYCERIN (NITROSTAT) 0.4 MG SL tablet Place 1 tablet (0.4 mg total) under  the tongue every 5 (five) minutes x 3 doses as needed for chest pain. 30 tablet 1  . pantoprazole (PROTONIX) 40 MG tablet Take 1 tablet (40 mg total) by mouth daily. 30 tablet 1  . pantoprazole (PROTONIX) 40 MG tablet TAKE 1 TABLET BY MOUTH ONCE DAILY 30 tablet 1  . pantoprazole (PROTONIX) 40 MG tablet TAKE 1 TABLET (40 MG TOTAL) BY MOUTH DAILY. 30 tablet 1  . ranolazine (RANEXA) 1000 MG SR tablet TAKE 1 TABLET BY MOUTH TWICE DAILY 60 tablet 1  .  SYMBICORT 160-4.5 MCG/ACT inhaler Inhale 1 puff into the lungs as needed.    . Vitamin D, Ergocalciferol, (DRISDOL) 1.25 MG (50000 UNIT) CAPS capsule Take 50,000 Units by mouth once a week.     No current facility-administered medications on file prior to visit.    Cardiovascular & other pertient studies:  EKG 02/07/2021:  Sinus rhythm 64 bpm First degree A-V block  Old anterior infarct IVCD Nonspecific T-abnormality Low voltage  Coronary intervention 01/30/2021: LM: Distal LM 40% stenosis LAD: Prox occlusion LCx: Severe calcification throughout the vessel Prox calcific 80% stenosis OM1 Ostial 60%, followed by prox 90% stenosis  Successful percutaneous coronary intervention OM1 Orbital atherectomy, PTCA and stent placement 3.5 X 34 mm Resolute Onyx drug-eluting stent Attempted percutaneous intervention LCx. Unable to advance stent. Non-flow limiting dissection in prox LCx   There is TIMI III flow down LCx and OM. Patient is chest pain free with no EKG or hemodynamic changes. At this point, I decided to abort the procedure, as further attempts would likely increase risk of complications. Will monitor overnight, and consider repeat attempt in a few weeks.    Coronary intervention 01/15/2021: Attempted angioplasty proximal RCA. Unsuccessful attempt. No complications.   Coronary angiography 01/12/2021: LM: Distal calcific 50% stenosis LAD: Prox CTO with weak left-to-left collaterals from LCx/OM LCx: Severe calcification throughout the vessel         Prox calcific 80% stenosis         OM1 Ostial 60%, followed by prox 90% stenosis RCA: Prox calcific 90% tubular stenosis         Mid 40% stenosis  LVEDP 1 mmHg  Severe calcific multivessel CAD Patient's IV NTG was held due to hypotension. However, it was resumed at 20 mcg/min due to chest pain. Chest pain improved.   CT surgery consulted for consideration for CABG  Echocardiogram 01/12/2021: 1. Left  ventricular ejection fraction, by estimation, is 40 to 45%. The  left ventricle has mildly decreased function. The left ventricle  demonstrates regional wall motion abnormalities (see scoring  diagram/findings for description). There is mild left  ventricular hypertrophy. Left ventricular diastolic parameters are  consistent with Grade I diastolic dysfunction (impaired relaxation).  2. Right ventricular systolic function was not well visualized. The right  ventricular size is not well visualized.  3. The mitral valve is grossly normal. Mild mitral valve regurgitation.  4. The aortic valve is tricuspid. There is mild thickening of the aortic  valve. Aortic valve regurgitation is not visualized.   Recent labs: 03/19/2021: Glucose 84, BUN/Cr 18/1.42. EGFR 53. Na/K 139/4.0.   01/16/2021: Glucose 100, BUN/Cr 27/1.86. EGFR 39. Na/K 130/4.0. Albumin 3.0. AST 72. Rest of the CMP normal H/H 13/37. MCV 91. Platelets 204 HbA1C 5.3% Chol 179, TG 107, HDL 49, LDL 109 TSH N/A    Review of Systems  Cardiovascular: Positive for dyspnea on exertion. Negative for chest pain, leg swelling, palpitations and syncope.  Skin:  Right groin bruising        Vitals:   03/22/21 1241  BP: 109/66  Pulse: 64  Resp: 17  Temp: 98.6 F (37 C)  SpO2: 96%     Body mass index is 33.91 kg/m. Filed Weights   03/22/21 1241  Weight: 250 lb (113.4 kg)     Objective:   Physical Exam Vitals and nursing note reviewed.  Constitutional:      General: He is not in acute distress. Neck:     Vascular: No JVD.  Cardiovascular:     Rate and Rhythm: Normal rate and regular rhythm.     Heart sounds: Normal heart sounds. No murmur heard.   Pulmonary:     Effort: Pulmonary effort is normal.     Breath sounds: Normal breath sounds. No wheezing or rales.  Musculoskeletal:     Right lower leg: No edema.     Left lower leg: No edema.  Skin:    Comments: Ecchymosis over right flank, right groin, and  right scrotum.  Mild tenderness medial to access site.           Assessment & Recommendations:   70 year old Caucasian male with hypertension, multivessel CAD, deemed not CABG candidate, HFrEF, former >60 PY smoker, CKD stage 3a, h/o squamous cell skin cancer, h/o COVID pneumonia in 11/2020 with residual fibrotic changes, possible interstitial lung disease  CAD: Multivessel CAD with LAD CTO.  Severe proximal RCA and distal LM/proximal LCx/OM1 disease (01/2021) Not a surgical candidate.  Unsuccessful revascularization attempt of proximal RCA (01/2021) Successful atherectomy and PCI to OM (01/2021).  Unable to deliver stent to circumflex with nonflow limiting dissection. Resolution of anginal symptoms at this time.  Explained to the patient that in the short run, he will likely do well without any symptoms of angina.  He does have increased risk of restenosis in the long term due to residual stenosis in left circumflex and distal left main, as well as nonflow limiting dissection. His exertional dyspnea has improved, but not resolved. I explained to the patient that his shortness of breath is likely multifactorial and more likely related to his pulmonary pathology.  I recommend follow up with pulmonology. If dyspnea persists in spite of pulnonary workup and management, could consider complex PCI to residual prox LCX severe stenosis. Continue dual antiplatelet therapy with aspirin and Brilinta till 01/2022. Continue Lipitor 80 mg daily, carvedilol 6.25 mg twice daily, Ranexa 1000 mg twice daily. I will see him back in 4 weeks for echocardiogram and f/u.  HFrEF: Clinically euvolumic. NYHA class II-III Continue current medical therapy. Continue Entresto 24-26 mg twice daily, carvedilol 6.25 mg bid.    Nigel Mormon, MD Pager: [phone] Office: [phone]

## 2021-04-11 NOTE — Telephone Encounter (Signed)
From patient.

## 2021-04-19 ENCOUNTER — Other Ambulatory Visit: Payer: Self-pay

## 2021-04-19 DIAGNOSIS — I5020 Unspecified systolic (congestive) heart failure: Secondary | ICD-10-CM

## 2021-04-19 DIAGNOSIS — I214 Non-ST elevation (NSTEMI) myocardial infarction: Secondary | ICD-10-CM

## 2021-04-20 ENCOUNTER — Encounter: Payer: Self-pay | Admitting: Cardiology

## 2021-04-20 ENCOUNTER — Ambulatory Visit: Payer: Medicare Other | Admitting: Cardiology

## 2021-04-20 ENCOUNTER — Other Ambulatory Visit: Payer: Self-pay

## 2021-04-20 ENCOUNTER — Ambulatory Visit: Payer: Medicare Other

## 2021-04-20 VITALS — BP 115/64 | HR 57 | Resp 16 | Ht 72.0 in | Wt 248.0 lb

## 2021-04-20 DIAGNOSIS — I25118 Atherosclerotic heart disease of native coronary artery with other forms of angina pectoris: Secondary | ICD-10-CM

## 2021-04-20 DIAGNOSIS — I5020 Unspecified systolic (congestive) heart failure: Secondary | ICD-10-CM

## 2021-04-20 DIAGNOSIS — I214 Non-ST elevation (NSTEMI) myocardial infarction: Secondary | ICD-10-CM

## 2021-04-20 NOTE — Progress Notes (Signed)
Follow up visit  Subjective:   Darrell Simpson, male    DOB: Aug 06, 1951, 70 y.o.   MRN: [id number]   HPI   Chief Complaint  Patient presents with   Coronary Artery Disease   HFrEF    Follow-up     70 year old Caucasian male with hypertension, multivessel CAD, deemed not CABG candidate, HFrEF, former >60 PY smoker, CKD stage 3a, h/o squamous cell skin cancer, h/o COVID pneumonia in 11/2020 with residual fibrotic changes, possible interstitial lung disease  Patient was hospitalized with non-STEMI in 01/2020.  Diagnostic coronary angiogram showed multivessel CAD with CTO LAD, severely calcified LCx/OM1 bifurcation stenosis, severe proximal RCA stenosis-likely culprit lesion.  CT surgery consult was sought.  Given patient's underlying lung issues, and poor targets for LAD, he was deemed not to be a surgical candidate.  Medical therapy is limited due to hypotension.  PCI attempt to complex RCA lesion was unsuccessful. I recommended optimization of medical management and consideration for LCx PCI outpatient, possibly starting [facility] outpatient, after stabilization of renal function. He was evaluated by pulmonomolgy and recommendation inhaled steroids and bronchodilators along with outpatient follow up. Supplemetal oxygen and nebulizer was arranged.   Subsequently, patient underwent complex atherectomy and PCI attempted to mid LCx/OM (01/30/2021).  However successfully stented.  However, stent could not be delivered to LCx.  Due to small nonflow limiting dissection, further attempts of stent delivery were aborted.  Patient had intact TIMI-3 flow to the circumflex and no chest pain or EKG changes suggestive of ischemia.  Patient is here for follow-up visit.  He has not had any chest pain. His exertional dyspnea continues to improve. He is yet to see pulmonology.   Recent echocardiogram results reviewed with the patient, details below.     Current Outpatient Medications on File Prior to Visit   Medication Sig Dispense Refill   albuterol (VENTOLIN HFA) 108 (90 Base) MCG/ACT inhaler Inhale 2 puffs into the lungs every 4 (four) hours as needed for wheezing or shortness of breath.     aspirin EC 81 MG EC tablet Take 1 tablet (81 mg total) by mouth daily. Swallow whole. 30 tablet 11   atorvastatin (LIPITOR) 80 MG tablet TAKE 1 TABLET BY MOUTH ONCE DAILY 30 tablet 1   BRILINTA 90 MG TABS tablet TAKE 1 TABLET BY MOUTH TWICE DAILY 60 tablet 1   carvedilol (COREG) 6.25 MG tablet Take 1 tablet (6.25 mg total) by mouth 2 (two) times daily. 60 tablet 1   Cyanocobalamin (VITAMIN B 12) 500 MCG TABS Take by mouth as needed. Pt takes 1-2 x a month     ENTRESTO 24-26 MG TAKE 1 TABLET BY MOUTH 2 TIMES DAILY 60 tablet 6   furosemide (LASIX) 40 MG tablet TAKE 1 TABLET BY MOUTH ONCE DAILY 30 tablet 1   HYDROcodone-acetaminophen (NORCO) 10-325 MG tablet Take 1 tablet by mouth every 4 (four) hours as needed for moderate pain.     ipratropium-albuterol (DUONEB) 0.5-2.5 (3) MG/3ML SOLN Take 3 mLs by nebulization 4 (four) times daily. 360 mL 1   mometasone-formoterol (DULERA) 100-5 MCG/ACT AERO Inhale 2 puffs into the lungs 2 (two) times daily. 13 g 1   nitroGLYCERIN (NITROSTAT) 0.4 MG SL tablet Place 1 tablet (0.4 mg total) under the tongue every 5 (five) minutes x 3 doses as needed for chest pain. 30 tablet 1   pantoprazole (PROTONIX) 40 MG tablet TAKE 1 TABLET BY MOUTH ONCE DAILY 30 tablet 1   ranolazine (RANEXA) 1000 MG SR  tablet TAKE 1 TABLET BY MOUTH TWICE DAILY 60 tablet 1   SYMBICORT 160-4.5 MCG/ACT inhaler Inhale 1 puff into the lungs as needed.     Vitamin D, Ergocalciferol, (DRISDOL) 1.25 MG (50000 UNIT) CAPS capsule Take 50,000 Units by mouth once a week.     No current facility-administered medications on file prior to visit.    Cardiovascular & other pertient studies:  Echocardiogram 04/20/2021: Left ventricle cavity is normal in size. Moderate concentric hypertrophy of the left ventricle.  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 Low normal LVEF 50-55%. Doppler evidence of grade I (impaired) diastolic dysfunction, normal LAP. Calculated EF 50%. Mild tricuspid regurgitation. Estimated pulmonary artery systolic pressure 24 mmHg. Compared to previous study in 01/2021, EF is marginally improved from 40-45%.   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 Unable to advance stent. Non-flow limiting dissection in prox LCx     There is TIMI III flow down LCx and OM. Patient is chest pain free with no EKG or hemodynamic changes. At this point, I decided to abort the procedure, as further attempts would likely increase risk of complications. Will monitor overnight, and consider repeat attempt in a few weeks.    Coronary intervention 01/15/2021: Attempted angioplasty proximal RCA. Unsuccessful attempt. No complications.   Coronary angiography 01/12/2021: LM: Distal calcific 50% stenosis LAD: Prox CTO with weak left-to-left collaterals from LCx/OM LCx: Severe calcification throughout the vessel         Prox calcific 80% stenosis         OM1 Ostial 60%, followed by prox 90% stenosis RCA: Prox calcific 90% tubular stenosis         Mid 40% stenosis   LVEDP 1 mmHg   Severe calcific multivessel CAD Patient's IV NTG was held due to hypotension. However, it was resumed at 20 mcg/min due to chest pain. Chest pain improved.    CT surgery consulted for consideration for CABG  Echocardiogram 01/12/2021:  1.  Left ventricular ejection fraction, by estimation, is 40 to 45%. The  left ventricle has mildly decreased function. The left ventricle  demonstrates regional wall motion abnormalities (see scoring  diagram/findings for description). There is mild left  ventricular hypertrophy. Left ventricular diastolic parameters are  consistent with Grade I diastolic dysfunction (impaired relaxation).   2. Right ventricular systolic function was not well visualized. The right  ventricular size is not well visualized.   3. The mitral valve is grossly normal. Mild mitral valve regurgitation.   4. The aortic valve is tricuspid. There is mild thickening of the aortic  valve. Aortic valve regurgitation is not visualized.   Recent labs: 03/19/2021: Glucose 84, BUN/Cr 18/1.42. EGFR 53. Na/K 139/4.0.   01/16/2021: Glucose 100, BUN/Cr 27/1.86. EGFR 39. Na/K 130/4.0. Albumin 3.0. AST 72. Rest of the CMP normal H/H 13/37. MCV 91. Platelets 204 HbA1C 5.3% Chol 179, TG 107, HDL 49, LDL 109 TSH N/A    Review of Systems  Cardiovascular:  Positive for dyspnea on exertion (Improving). Negative for chest  pain, leg swelling, palpitations and syncope.      Vitals:   04/20/21 1352  BP: 115/64  Pulse: (!) 57  Resp: 16  SpO2: 95%     Body mass index is 33.63 kg/m. Filed Weights   04/20/21 1352  Weight: 248 lb (112.5 kg)     Objective:   Physical Exam Vitals and nursing note reviewed.  Constitutional:      General: He is not in acute distress. Neck:     Vascular: No JVD.  Cardiovascular:     Rate and Rhythm: Normal rate and regular rhythm.     Heart sounds: Normal heart sounds. No murmur heard. Pulmonary:     Effort: Pulmonary effort is normal.     Breath sounds: Normal breath sounds. No wheezing or rales.  Musculoskeletal:     Right lower leg: No edema.     Left lower leg: No edema.          Assessment & Recommendations:   70 year old Caucasian male with hypertension, multivessel CAD,  deemed not CABG candidate, HFrEF, former >60 PY smoker, CKD stage 3a, h/o squamous cell skin cancer, h/o COVID pneumonia in 11/2020 with residual fibrotic changes, possible interstitial lung disease  CAD: Multivessel CAD with LAD CTO.  Severe proximal RCA and distal LM/proximal LCx/OM1 disease (01/2021) Not a surgical candidate.  Unsuccessful revascularization attempt of proximal RCA (01/2021) Successful atherectomy and PCI to OM (01/2021).  Unable to deliver stent to circumflex with nonflow limiting dissection. Resolution of anginal symptoms at this time.  Explained to the patient that in the short run, he will likely do well without any symptoms of angina.  He does have increased risk of restenosis in the long term due to residual stenosis in left circumflex and distal left main, as well as nonflow limiting dissection. His exertional dyspnea has improved, but not resolved. Patient understands that his shortness of breath is likely multifactorial and more likely related to his pulmonary pathology.  I recommend follow up with pulmonology. If dyspnea persists in spite of pulnonary workup and management, could consider complex PCI to residual prox LCX severe stenosis. Continue dual antiplatelet therapy with aspirin and Brilinta till 01/2022. Continue Lipitor 80 mg daily, carvedilol 6.25 mg twice daily, Ranexa 1000 mg twice daily. I will see him back in 4 weeks for echocardiogram and f/u.  HFrEF: Clinically euvolumic. NYHA class I-II. LVEF improved to 50-55% (04/2021) Continue current medical therapy. Continue Entresto 24-26 mg twice daily, carvedilol 6.25 mg bid.  F/u in 6 weeks   Nigel Mormon, MD Pager: [phone] Office: [phone]

## 2021-05-04 ENCOUNTER — Other Ambulatory Visit (HOSPITAL_COMMUNITY)
Admission: RE | Admit: 2021-05-04 | Discharge: 2021-05-04 | Disposition: A | Discharge status: Home / Self Care | Payer: Medicare Other | Source: Ambulatory Visit | Attending: Internal Medicine | Admitting: Internal Medicine

## 2021-05-04 DIAGNOSIS — Z01812 Encounter for preprocedural laboratory examination: Secondary | ICD-10-CM | POA: Insufficient documentation

## 2021-05-04 DIAGNOSIS — Z20822 Contact with and (suspected) exposure to covid-19: Secondary | ICD-10-CM | POA: Diagnosis not present

## 2021-05-04 LAB — SARS CORONAVIRUS 2 (TAT 6-24 HRS): SARS Coronavirus 2: NEGATIVE

## 2021-05-07 ENCOUNTER — Inpatient Hospital Stay: Payer: Medicare Other | Admitting: Internal Medicine

## 2021-05-07 ENCOUNTER — Inpatient Hospital Stay: Payer: Medicare Other | Admitting: Primary Care

## 2021-05-11 ENCOUNTER — Other Ambulatory Visit: Payer: Self-pay | Admitting: Cardiology

## 2021-05-28 ENCOUNTER — Inpatient Hospital Stay: Payer: Medicare Other | Admitting: Primary Care

## 2021-06-04 ENCOUNTER — Other Ambulatory Visit: Payer: Self-pay

## 2021-06-04 DIAGNOSIS — I5020 Unspecified systolic (congestive) heart failure: Secondary | ICD-10-CM

## 2021-06-04 MED ORDER — ENTRESTO 24-26 MG PO TABS: 1.0000 | ORAL_TABLET | Freq: Two times a day (BID) | ORAL | 6 refills | Status: DC

## 2021-06-04 MED ORDER — ASPIRIN 81 MG PO TBEC: 81.0000 mg | DELAYED_RELEASE_TABLET | Freq: Every day | ORAL | 11 refills | Status: AC

## 2021-06-04 MED ORDER — RANOLAZINE ER 1000 MG PO TB12: 1000.0000 mg | ORAL_TABLET | Freq: Two times a day (BID) | ORAL | 1 refills | Status: DC

## 2021-06-04 MED ORDER — TICAGRELOR 90 MG PO TABS: 90.0000 mg | ORAL_TABLET | Freq: Two times a day (BID) | ORAL | 1 refills | Status: DC

## 2021-06-04 MED ORDER — CARVEDILOL 6.25 MG PO TABS: 6.2500 mg | ORAL_TABLET | Freq: Two times a day (BID) | ORAL | 1 refills | Status: DC

## 2021-06-04 MED ORDER — FUROSEMIDE 40 MG PO TABS: 40.0000 mg | ORAL_TABLET | Freq: Every day | ORAL | 1 refills | Status: DC

## 2021-06-04 MED ORDER — ATORVASTATIN CALCIUM 80 MG PO TABS: 80.0000 mg | ORAL_TABLET | Freq: Every day | ORAL | 1 refills | Status: DC

## 2021-06-08 ENCOUNTER — Ambulatory Visit: Payer: Medicare Other | Admitting: Cardiology

## 2021-06-27 ENCOUNTER — Other Ambulatory Visit: Payer: Self-pay | Admitting: Cardiology

## 2021-08-01 ENCOUNTER — Other Ambulatory Visit: Payer: Self-pay | Admitting: Cardiology

## 2021-08-01 DIAGNOSIS — I5020 Unspecified systolic (congestive) heart failure: Secondary | ICD-10-CM

## 2021-08-15 ENCOUNTER — Ambulatory Visit: Payer: Medicare Other | Admitting: Cardiology

## 2021-08-27 ENCOUNTER — Other Ambulatory Visit: Payer: Self-pay | Admitting: Cardiology

## 2021-08-29 ENCOUNTER — Ambulatory Visit: Payer: Medicare Other | Admitting: Cardiology

## 2021-08-29 NOTE — Progress Notes (Signed)
Error

## 2021-09-19 ENCOUNTER — Encounter: Payer: Self-pay | Admitting: Cardiology

## 2021-09-19 ENCOUNTER — Ambulatory Visit: Payer: Medicare Other | Admitting: Cardiology

## 2021-09-19 ENCOUNTER — Other Ambulatory Visit: Payer: Self-pay

## 2021-09-19 VITALS — BP 137/72 | HR 55 | Temp 98.1°F | Resp 16 | Ht 72.0 in | Wt 234.0 lb

## 2021-09-19 DIAGNOSIS — I25118 Atherosclerotic heart disease of native coronary artery with other forms of angina pectoris: Secondary | ICD-10-CM

## 2021-09-19 DIAGNOSIS — E782 Mixed hyperlipidemia: Secondary | ICD-10-CM

## 2021-09-19 DIAGNOSIS — I5020 Unspecified systolic (congestive) heart failure: Secondary | ICD-10-CM

## 2021-09-19 NOTE — Progress Notes (Signed)
Follow up visit  Subjective:   Darrell Simpson, male    DOB: 1951-05-24, 70 y.o.   MRN: [id number]   HPI   Chief Complaint  Patient presents with   Coronary Artery Disease   Shortness of Breath   Follow-up     70 year old Caucasian male with hypertension, multivessel CAD, deemed not CABG candidate, HFrEF, former >60 PY smoker, CKD stage 3a, h/o squamous cell skin cancer, h/o COVID pneumonia in 11/2020 with residual fibrotic changes, possible interstitial lung disease  Patient was hospitalized with non-STEMI in 01/2020.  Diagnostic coronary angiogram showed multivessel CAD with CTO LAD, severely calcified LCx/OM1 bifurcation stenosis, severe proximal RCA stenosis-likely culprit lesion.  CT surgery consult was sought.  Given patient's underlying lung issues, and poor targets for LAD, he was deemed not to be a surgical candidate.  Medical therapy is limited due to hypotension.  PCI attempt to complex RCA lesion was unsuccessful. I recommended optimization of medical management and consideration for LCx PCI outpatient, possibly starting [facility] outpatient, after stabilization of renal function. He was evaluated by pulmonomolgy and recommendation inhaled steroids and bronchodilators along with outpatient follow up. Supplemetal oxygen and nebulizer was arranged.   Subsequently, patient underwent complex atherectomy and PCI attempted to mid LCx/OM (01/30/2021).  However successfully stented.  However, stent could not be delivered to LCx.  Due to small nonflow limiting dissection, further attempts of stent delivery were aborted.  Patient had intact TIMI-3 flow to the circumflex and no chest pain or EKG changes suggestive of ischemia.  Patient is here for follow-up visit.  He has not had any chest pain. His exertional dyspnea has significantly improved, although not resolved. He states that he feels "10-15 years younger".   He has regular follow up with pulmonologist Dr. Andrey Farmer in [place]. He is  reportedly few areas in is right upper lung and pulmonary nodule. Details not available to me. He has next CT scan set up for April 2023.  Current Outpatient Medications on File Prior to Visit  Medication Sig Dispense Refill   albuterol (VENTOLIN HFA) 108 (90 Base) MCG/ACT inhaler Inhale 2 puffs into the lungs every 4 (four) hours as needed for wheezing or shortness of breath.     aspirin 81 MG EC tablet Take 1 tablet (81 mg total) by mouth daily. Swallow whole. 30 tablet 11   atorvastatin (LIPITOR) 80 MG tablet TAKE 1 TABLET BY MOUTH ONCE DAILY 30 tablet 1   BRILINTA 90 MG TABS tablet TAKE 1 TABLET BY MOUTH TWICE DAILY 60 tablet 1   carvedilol (COREG) 6.25 MG tablet TAKE 1 TABLET BY MOUTH TWICE DAILY 60 tablet 1   Cyanocobalamin (VITAMIN B 12) 500 MCG TABS Take by mouth as needed. Pt takes 1-2 x a month     furosemide (LASIX) 40 MG tablet TAKE 1 TABLET BY MOUTH ONCE DAILY 30 tablet 1   HYDROcodone-acetaminophen (NORCO) 10-325 MG tablet Take 1 tablet by mouth every 4 (four) hours as needed for moderate pain.     ipratropium-albuterol (DUONEB) 0.5-2.5 (3) MG/3ML SOLN Take 3 mLs by nebulization 4 (four) times daily. 360 mL 1   nitroGLYCERIN (NITROSTAT) 0.4 MG SL tablet Place 1 tablet (0.4 mg total) under the tongue every 5 (five) minutes x 3 doses as needed for chest pain. 30 tablet 1   pantoprazole (PROTONIX) 40 MG tablet TAKE 1 TABLET BY MOUTH ONCE DAILY 30 tablet 1   ranolazine (RANEXA) 1000 MG SR tablet TAKE 1 TABLET BY MOUTH TWICE DAILY 60  tablet 1   sacubitril-valsartan (ENTRESTO) 24-26 MG Take 1 tablet by mouth 2 (two) times daily. 60 tablet 6   Vitamin D, Ergocalciferol, (DRISDOL) 1.25 MG (50000 UNIT) CAPS capsule Take 50,000 Units by mouth once a week.     No current facility-administered medications on file prior to visit.    Cardiovascular & other pertient studies:  EKG 09/19/2021: Sinus rhythm 60 bpm First degree A-V block  Low voltage in precordial leads.  Incomplete left bundle  branch block Old anterolateral ifarct  Echocardiogram 04/20/2021: Left ventricle cavity is normal in size. Moderate concentric hypertrophy of the left ventricle. 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 Low normal LVEF 50-55%. Doppler evidence of grade I (impaired) diastolic dysfunction, normal LAP. Calculated EF 50%. Mild tricuspid regurgitation. Estimated pulmonary artery systolic pressure 24 mmHg. Compared to previous study in 01/2021, EF is marginally improved from 40-45%.   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 Unable to advance stent. Non-flow limiting dissection in prox LCx     There is TIMI III flow down LCx and OM. Patient is chest pain free with no EKG or hemodynamic changes. At this point, I decided to abort the procedure, as further attempts would likely increase risk of complications. Will monitor overnight, and consider repeat attempt in a few weeks.    Coronary intervention 01/15/2021: Attempted angioplasty proximal RCA. Unsuccessful attempt. No complications.   Coronary angiography 01/12/2021: LM: Distal calcific 50% stenosis LAD: Prox CTO with weak left-to-left collaterals from LCx/OM LCx: Severe calcification throughout the vessel         Prox calcific 80% stenosis         OM1 Ostial 60%, followed by prox 90% stenosis RCA: Prox calcific 90% tubular stenosis         Mid 40% stenosis   LVEDP 1 mmHg   Severe calcific multivessel CAD Patient's IV NTG was held due to hypotension.  However, it was resumed at 20 mcg/min due to chest pain. Chest pain improved.    CT surgery consulted for consideration for CABG  Echocardiogram 01/12/2021:  1. Left ventricular ejection fraction, by estimation, is 40 to 45%. The  left ventricle has mildly decreased function. The left ventricle  demonstrates regional wall motion abnormalities (see scoring  diagram/findings for description). There is mild left  ventricular hypertrophy. Left ventricular diastolic parameters are  consistent with Grade I diastolic dysfunction (impaired relaxation).   2. Right ventricular systolic function was not well visualized. The right  ventricular size is not well visualized.   3. The mitral valve is grossly normal. Mild mitral valve regurgitation.   4. The aortic valve is tricuspid. There is mild thickening of the aortic  valve. Aortic valve regurgitation is not visualized.   Recent labs: 03/19/2021: Glucose 84, BUN/Cr 18/1.42. EGFR 53. Na/K 139/4.0.   01/16/2021: Glucose 100, BUN/Cr 27/1.86. EGFR 39. Na/K 130/4.0. Albumin 3.0. AST 72. Rest of the CMP normal H/H 13/37. MCV 91. Platelets 204 HbA1C 5.3% Chol 179, TG 107, HDL 49, LDL 109 TSH  N/A    Review of Systems  Cardiovascular:  Positive for dyspnea on exertion (Improving). Negative for chest pain, leg swelling, palpitations and syncope.      Vitals:   09/19/21 1134  BP: 137/72  Pulse: (!) 55  Resp: 16  Temp: 98.1 F (36.7 C)  SpO2: 95%     Body mass index is 31.74 kg/m. Filed Weights   09/19/21 1134  Weight: 234 lb (106.1 kg)     Objective:   Physical Exam Vitals and nursing note reviewed.  Constitutional:      General: He is not in acute distress. Neck:     Vascular: No JVD.  Cardiovascular:     Rate and Rhythm: Normal rate and regular rhythm.     Heart sounds: Normal heart sounds. No murmur heard. Pulmonary:     Effort: Pulmonary effort is normal.     Breath sounds: Normal breath sounds. No wheezing or rales.   Musculoskeletal:     Right lower leg: Edema (Trace) present.     Left lower leg: Edema (Trace) present.          Assessment & Recommendations:   70 year old Caucasian male with hypertension, multivessel CAD, deemed not CABG candidate, HFrEF, former >60 PY smoker, CKD stage 3a, h/o squamous cell skin cancer, h/o COVID pneumonia in 11/2020 with residual fibrotic changes, possible interstitial lung disease  CAD: Multivessel CAD with LAD CTO.  Severe proximal RCA and distal LM/proximal LCx/OM1 disease (01/2021) Not a surgical candidate.  Unsuccessful revascularization attempt of proximal RCA (01/2021) Successful atherectomy and PCI to OM (01/2021).  Unable to deliver stent to circumflex with nonflow limiting dissection. Resolution of anginal symptoms at this time.  Explained to the patient that in the short run, he will likely do well without any symptoms of angina.  He does have increased risk of restenosis in the long term due to residual stenosis in left circumflex and distal left main, as well as nonflow limiting dissection. His exertional dyspnea has continued to improve. He has follow up with pulmonology for certain abnormal findings on CT chest. I think even if the dyspnea is anginal equivalent, givne continued clinical improvement, I will hold off any coronary intervention at this time.  Continue dual antiplatelet therapy with aspirin and Brilinta till 01/2022. Continue Lipitor 80 mg daily, carvedilol 6.25 mg twice daily, Ranexa 1000 mg twice daily. Check lipid panel  HFrEF: Clinically euvolumic. NYHA class I-II. LVEF improved to 50-55% (04/2021) Continue current medical therapy. Continue Entresto 24-26 mg twice daily, carvedilol 6.25 mg bid.  Echocardiogram and same day f/u visit in 02/2022. In the meantime, will obtain records from pulmonology   Nigel Mormon, MD Pager: [phone] Office: [phone]

## 2021-09-20 ENCOUNTER — Encounter: Payer: Self-pay | Admitting: Cardiology

## 2021-09-20 DIAGNOSIS — E782 Mixed hyperlipidemia: Secondary | ICD-10-CM | POA: Insufficient documentation

## 2021-09-20 LAB — BASIC METABOLIC PANEL
BUN/Creatinine Ratio: 13 (ref 10–24)
BUN: 18 mg/dL (ref 8–27)
CO2: 19 mmol/L — ABNORMAL LOW (ref 20–29)
Calcium: 8.6 mg/dL (ref 8.6–10.2)
Chloride: 105 mmol/L (ref 96–106)
Creatinine, Ser: 1.34 mg/dL — ABNORMAL HIGH (ref 0.76–1.27)
Glucose: 142 mg/dL — ABNORMAL HIGH (ref 70–99)
Potassium: 3.9 mmol/L (ref 3.5–5.2)
Sodium: 139 mmol/L (ref 134–144)
eGFR: 57 mL/min/{1.73_m2} — ABNORMAL LOW (ref >59)

## 2021-09-20 LAB — LIPID PANEL
Chol/HDL Ratio: 3 ratio (ref 0.0–5.0)
Cholesterol, Total: 106 mg/dL (ref 100–199)
HDL: 35 mg/dL — ABNORMAL LOW (ref >39)
LDL Chol Calc (NIH): 30 mg/dL (ref 0–99)
Triglycerides: 274 mg/dL — ABNORMAL HIGH (ref 0–149)
VLDL Cholesterol Cal: 41 mg/dL — ABNORMAL HIGH (ref 5–40)

## 2021-09-21 ENCOUNTER — Other Ambulatory Visit: Payer: Self-pay | Admitting: Cardiology

## 2021-09-21 DIAGNOSIS — E782 Mixed hyperlipidemia: Secondary | ICD-10-CM

## 2021-09-24 ENCOUNTER — Other Ambulatory Visit: Payer: Self-pay | Admitting: Cardiology

## 2021-09-24 DIAGNOSIS — I5020 Unspecified systolic (congestive) heart failure: Secondary | ICD-10-CM

## 2021-10-19 ENCOUNTER — Other Ambulatory Visit: Payer: Self-pay | Admitting: Cardiology

## 2021-11-19 ENCOUNTER — Other Ambulatory Visit: Payer: Self-pay | Admitting: Cardiology

## 2021-11-19 DIAGNOSIS — I5020 Unspecified systolic (congestive) heart failure: Secondary | ICD-10-CM

## 2021-12-12 IMAGING — DX DG CHEST 1V PORT SAME DAY
1 series · 1 of 1 positions shown · non-contrast
Comparison: January 11, 2021

CLINICAL DATA: Shortness of breath.

EXAM:
PORTABLE CHEST 1 VIEW

[chest]
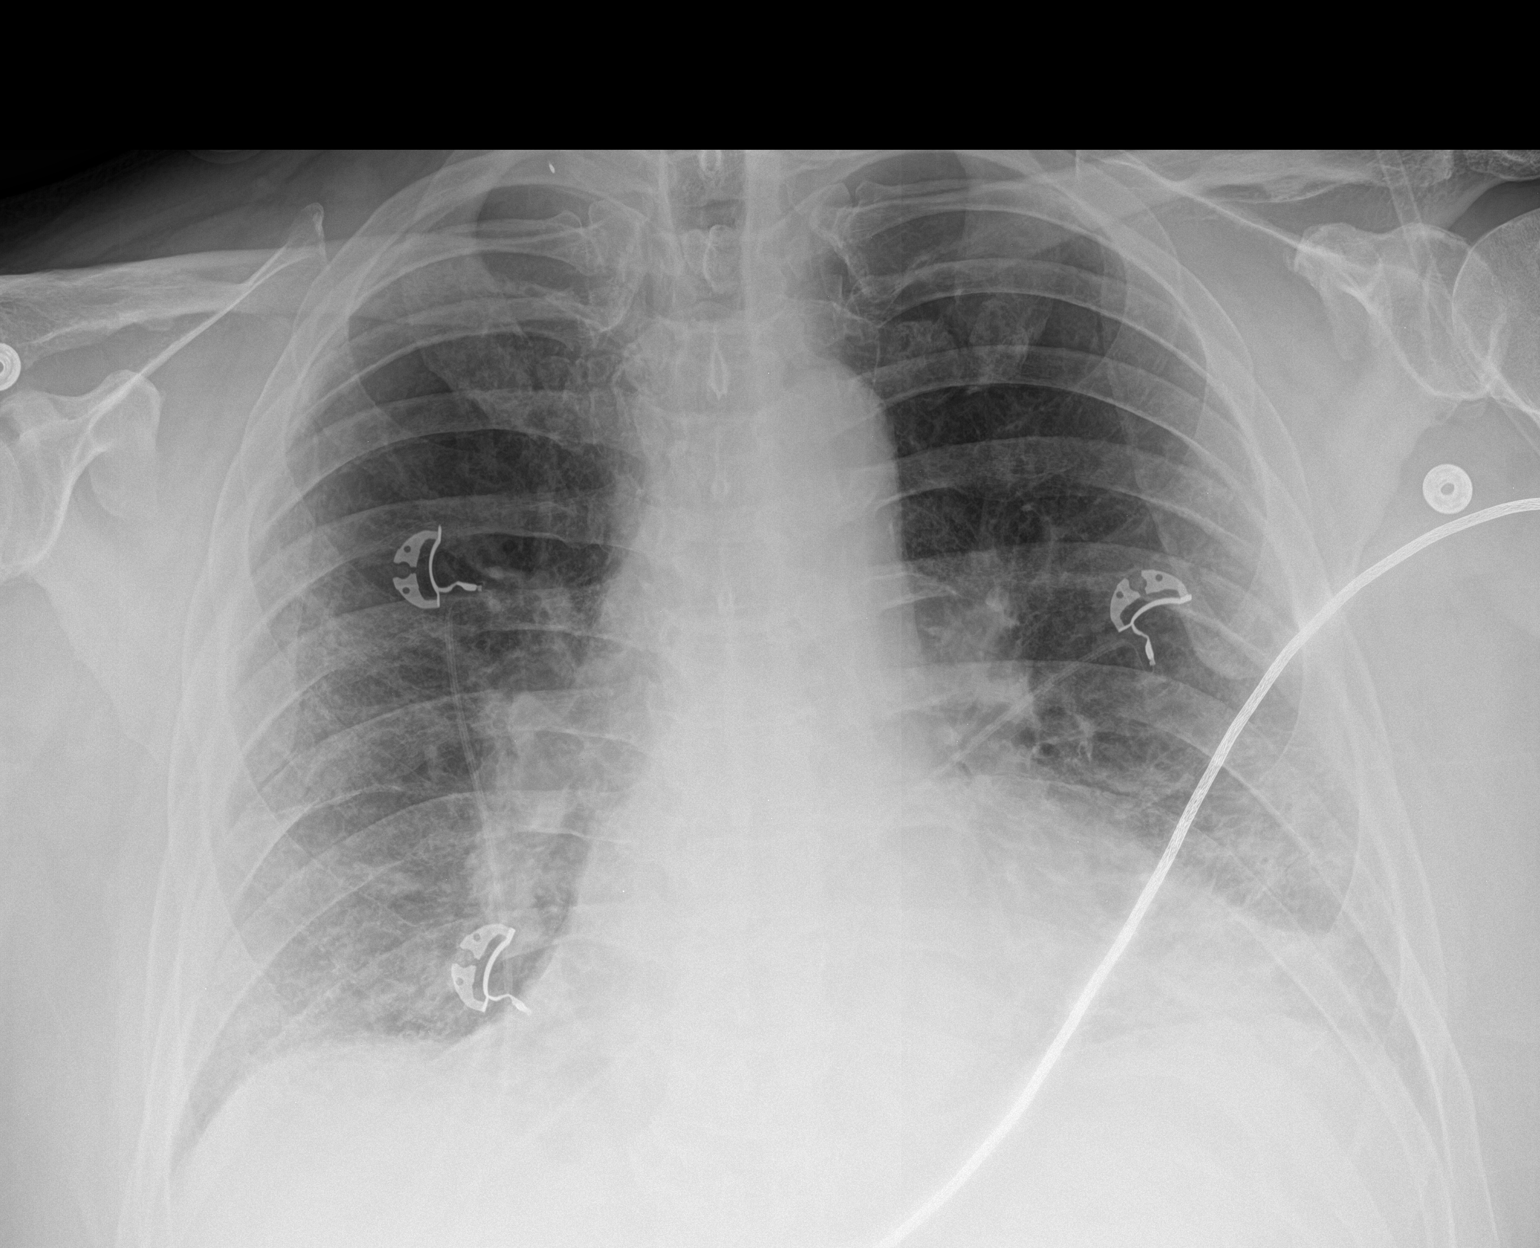

[1 of 1 positions shown; findings below may reference images not displayed]

FINDINGS: The cardiomediastinal silhouette is stable. No pneumothorax. Opacity
again seen in the left base in the periphery of the right lower
lung. No other interval changes.
IMPRESSION: Bilateral pulmonary opacities as above could represent edema or
atypical infection. Recommend clinical correlation and follow-up to
resolution.

## 2022-01-14 ENCOUNTER — Other Ambulatory Visit: Payer: Self-pay | Admitting: Cardiology

## 2022-01-14 DIAGNOSIS — I5020 Unspecified systolic (congestive) heart failure: Secondary | ICD-10-CM

## 2022-02-22 ENCOUNTER — Other Ambulatory Visit: Payer: Self-pay

## 2022-02-22 ENCOUNTER — Telehealth: Payer: Self-pay | Admitting: Cardiology

## 2022-02-22 DIAGNOSIS — I25118 Atherosclerotic heart disease of native coronary artery with other forms of angina pectoris: Secondary | ICD-10-CM

## 2022-02-22 DIAGNOSIS — I5020 Unspecified systolic (congestive) heart failure: Secondary | ICD-10-CM

## 2022-02-22 NOTE — Telephone Encounter (Signed)
Please put in an order for this patient's echo. Thank you. ?

## 2022-02-22 NOTE — Telephone Encounter (Signed)
Ok done

## 2022-02-25 ENCOUNTER — Ambulatory Visit: Payer: Medicare Other

## 2022-02-25 ENCOUNTER — Ambulatory Visit: Payer: Medicare Other | Admitting: Cardiology

## 2022-02-25 DIAGNOSIS — I5020 Unspecified systolic (congestive) heart failure: Secondary | ICD-10-CM

## 2022-03-07 ENCOUNTER — Encounter: Payer: Self-pay | Admitting: Cardiology

## 2022-03-07 ENCOUNTER — Ambulatory Visit: Payer: Medicare Other | Admitting: Cardiology

## 2022-03-07 VITALS — BP 130/66 | HR 49 | Temp 98.0°F | Resp 16 | Ht 72.0 in | Wt 244.0 lb

## 2022-03-07 DIAGNOSIS — I25118 Atherosclerotic heart disease of native coronary artery with other forms of angina pectoris: Secondary | ICD-10-CM

## 2022-03-07 DIAGNOSIS — E782 Mixed hyperlipidemia: Secondary | ICD-10-CM

## 2022-03-07 DIAGNOSIS — I5020 Unspecified systolic (congestive) heart failure: Secondary | ICD-10-CM

## 2022-03-07 NOTE — Progress Notes (Signed)
? ?Follow up visit ? ?Subjective:  ? ?Darrell Simpson, male    DOB: Feb 05, 1951, 71 y.o.   MRN: [id number] ? ? ?HPI ? ? ?Chief Complaint  ?Patient presents with  ? Coronary Artery Disease  ? Follow-up  ? Results  ?  echo  ? ?71 year old Caucasian male with hypertension, multivessel CAD, deemed not CABG candidate, HFrEF, former >60 PY smoker, CKD stage 3a, h/o squamous cell skin cancer, h/o COVID pneumonia in 11/2020 with residual fibrotic changes, possible interstitial lung disease ? ?Patient was hospitalized with non-STEMI in 01/2020.  Diagnostic coronary angiogram showed multivessel CAD with CTO LAD, severely calcified LCx/OM1 bifurcation stenosis, severe proximal RCA stenosis-likely culprit lesion.  CT surgery consult was sought.  Given patient's underlying lung issues, and poor targets for LAD, he was deemed not to be a surgical candidate.  Medical therapy is limited due to hypotension.  PCI attempt to complex RCA lesion was unsuccessful. I recommended optimization of medical management and consideration for LCx PCI outpatient, possibly starting [facility] outpatient, after stabilization of renal function. He was evaluated by pulmonomolgy and recommendation inhaled steroids and bronchodilators along with outpatient follow up. Supplemetal oxygen and nebulizer was arranged.  ? ?Subsequently, patient underwent complex atherectomy and PCI attempted to mid LCx/OM (01/30/2021).  However successfully stented.  However, stent could not be delivered to LCx.  Due to small nonflow limiting dissection, further attempts of stent delivery were aborted.  Patient had intact TIMI-3 flow to the circumflex and no chest pain or EKG changes suggestive of ischemia. ? ?Patient is here for follow-up visit.  He has not had any chest pain or overt exertional dyspnea. He has had difficulty with nausea and occasional vomiting sicne he was started on pulmonary fibrosis medication by his pulmonologist Dr. Andrey Farmer in [place]. ? ? ?Current Outpatient  Medications:  ?  albuterol (VENTOLIN HFA) 108 (90 Base) MCG/ACT inhaler, Inhale 2 puffs into the lungs every 4 (four) hours as needed for wheezing or shortness of breath., Disp: , Rfl:  ?  aspirin 81 MG EC tablet, Take 1 tablet (81 mg total) by mouth daily. Swallow whole., Disp: 30 tablet, Rfl: 11 ?  atorvastatin (LIPITOR) 80 MG tablet, TAKE 1 TABLET BY MOUTH ONCE DAILY, Disp: 90 tablet, Rfl: 1 ?  BRILINTA 90 MG TABS tablet, TAKE 1 TABLET BY MOUTH TWICE DAILY, Disp: 180 tablet, Rfl: 1 ?  carvedilol (COREG) 6.25 MG tablet, TAKE 1 TABLET BY MOUTH TWICE DAILY, Disp: 60 tablet, Rfl: 1 ?  Cyanocobalamin (VITAMIN B 12) 500 MCG TABS, Take by mouth as needed. Pt takes 1-2 x a month, Disp: , Rfl:  ?  furosemide (LASIX) 40 MG tablet, TAKE 1 TABLET BY MOUTH ONCE DAILY, Disp: 90 tablet, Rfl: 1 ?  HYDROcodone-acetaminophen (NORCO) 10-325 MG tablet, Take 1 tablet by mouth every 4 (four) hours as needed for moderate pain., Disp: , Rfl:  ?  ipratropium-albuterol (DUONEB) 0.5-2.5 (3) MG/3ML SOLN, Take 3 mLs by nebulization 4 (four) times daily., Disp: 360 mL, Rfl: 1 ?  nitroGLYCERIN (NITROSTAT) 0.4 MG SL tablet, Place 1 tablet (0.4 mg total) under the tongue every 5 (five) minutes x 3 doses as needed for chest pain., Disp: 30 tablet, Rfl: 1 ?  pantoprazole (PROTONIX) 40 MG tablet, TAKE 1 TABLET BY MOUTH ONCE DAILY, Disp: 90 tablet, Rfl: 1 ?  ranolazine (RANEXA) 1000 MG SR tablet, TAKE 1 TABLET BY MOUTH TWICE DAILY, Disp: 180 tablet, Rfl: 1 ?  sacubitril-valsartan (ENTRESTO) 24-26 MG, Take 1 tablet by mouth 2 (  two) times daily., Disp: 60 tablet, Rfl: 6 ?  Vitamin D, Ergocalciferol, (DRISDOL) 1.25 MG (50000 UNIT) CAPS capsule, Take 50,000 Units by mouth once a week., Disp: , Rfl:  ? ?Cardiovascular & other pertient studies: ? ?Echocardiogram 02/25/2022:  ?Normal LV systolic function with visual EF 50-55%. Left ventricle cavity  ?is normal in size. Mild left ventricular hypertrophy. Normal global wall  ?motion. Doppler evidence of grade  I (impaired) diastolic dysfunction,  ?normal LAP.  ?Mild tricuspid regurgitation. No evidence of pulmonary hypertension.  ?Compared to 04/20/2021 no significant change. ? ?EKG 09/19/2021: ?Sinus rhythm 60 bpm ?First degree A-V block  ?Low voltage in precordial leads.  ?Incomplete left bundle branch block ?Old anterolateral ifarct ? ?Coronary intervention 01/30/2021: ?LM: Distal LM 40% stenosis ?LAD: Prox occlusion ?LCx: Severe calcification throughout the vessel ?        Prox calcific 80% stenosis ?        OM1 Ostial 60%, followed by prox 90% stenosis ?  ?Successful percutaneous coronary intervention OM1 ?Orbital atherectomy, PTCA and stent placement 3.5 X 34 mm Resolute Onyx drug-eluting stent ?Attempted percutaneous intervention LCx. Unable to advance stent. ?Non-flow limiting dissection in prox LCx ?  ?  ?There is TIMI III flow down LCx and OM. Patient is chest pain free with no EKG or hemodynamic changes. At this point, I decided to abort the procedure, as further attempts would likely increase risk of complications. Will monitor overnight, and consider repeat attempt in a few weeks.  ? ? ?Coronary intervention 01/15/2021: ?Attempted angioplasty proximal RCA. ?Unsuccessful attempt. ?No complications. ? ? ?Coronary angiography 01/12/2021: ?LM: Distal calcific 50% stenosis ?LAD: Prox CTO with weak left-to-left collaterals from LCx/OM ?LCx: Severe calcification throughout the vessel ?        Prox calcific 80% stenosis ?        OM1 Ostial 60%, followed by prox 90% stenosis ?RCA: Prox calcific 90% tubular stenosis ?        Mid 40% stenosis ?  ?LVEDP 1 mmHg ?  ?Severe calcific multivessel CAD ?Patient's IV NTG was held due to hypotension. However, it was resumed at 20 mcg/min due to chest pain. Chest pain improved.  ?  ?CT surgery consulted for consideration for CABG ? ?Echocardiogram 01/12/2021: ? 1. Left ventricular ejection fraction, by estimation, is 40 to 45%. The  ?left ventricle has mildly decreased function. The left  ventricle  ?demonstrates regional wall motion abnormalities (see scoring  ?diagram/findings for description). There is mild left  ?ventricular hypertrophy. Left ventricular diastolic parameters are  ?consistent with Grade I diastolic dysfunction (impaired relaxation).  ? 2. Right ventricular systolic function was not well visualized. The right  ?ventricular size is not well visualized.  ? 3. The mitral valve is grossly normal. Mild mitral valve regurgitation.  ? 4. The aortic valve is tricuspid. There is mild thickening of the aortic  ?valve. Aortic valve regurgitation is not visualized.  ? ?Recent labs: ?09/19/2021: ?Chol 106, TG 274, HDL 35, LDL 30 ? ?03/19/2021: ?Glucose 84, BUN/Cr 18/1.42. EGFR 53. Na/K 139/4.0.  ? ?01/16/2021: ?Glucose 100, BUN/Cr 27/1.86. EGFR 39. Na/K 130/4.0. Albumin 3.0. AST 72. Rest of the CMP normal ?H/H 13/37. MCV 91. Platelets 204 ?HbA1C 5.3% ?Chol 179, TG 107, HDL 49, LDL 109 ?TSH N/A ? ? ? ?Review of Systems  ?Cardiovascular:  Positive for dyspnea on exertion (Improving). Negative for chest pain, leg swelling, palpitations and syncope.  ? ?   ?Vitals:  ? 03/07/22 1025  ?BP: 130/66  ?Pulse: (!) 49  ?  Resp: 16  ?Temp: 98 ?F (36.7 ?C)  ?SpO2: 97%  ? ? ? ?Body mass index is 33.09 kg/m?. ?Filed Weights  ? 03/07/22 1025  ?Weight: 244 lb (110.7 kg)  ? ? ? ?Objective:  ? Physical Exam ?Vitals and nursing note reviewed.  ?Constitutional:   ?   General: He is not in acute distress. ?Neck:  ?   Vascular: No JVD.  ?Cardiovascular:  ?   Rate and Rhythm: Normal rate and regular rhythm.  ?   Heart sounds: Normal heart sounds. No murmur heard. ?Pulmonary:  ?   Effort: Pulmonary effort is normal.  ?   Breath sounds: Normal breath sounds. No wheezing or rales.  ?Musculoskeletal:  ?   Right lower leg: Edema (Trace) present.  ?   Left lower leg: Edema (Trace) present.  ? ? ?  ICD-10-CM   ?1. Coronary artery disease involving native coronary artery of native heart with other form of angina pectoris ([place])   I25.118   ?  ?2. HFrEF (heart failure with reduced ejection fraction) (H[facility])  I50.20   ?  ?3. Mixed hyperlipidemia  E78.2 Lipid panel  ?  ? ?Orders Placed This Encounter  ?Procedures  ? Lipid panel  ? ?Med

## 2022-03-08 ENCOUNTER — Encounter: Payer: Self-pay | Admitting: Cardiology

## 2022-03-08 ENCOUNTER — Other Ambulatory Visit: Payer: Self-pay | Admitting: Cardiology

## 2022-03-08 DIAGNOSIS — I5020 Unspecified systolic (congestive) heart failure: Secondary | ICD-10-CM

## 2022-04-09 ENCOUNTER — Other Ambulatory Visit: Payer: Self-pay | Admitting: Cardiology

## 2022-04-09 DIAGNOSIS — I5020 Unspecified systolic (congestive) heart failure: Secondary | ICD-10-CM

## 2022-05-02 ENCOUNTER — Other Ambulatory Visit: Payer: Self-pay | Admitting: Cardiology

## 2022-05-02 DIAGNOSIS — I5020 Unspecified systolic (congestive) heart failure: Secondary | ICD-10-CM

## 2022-05-21 ENCOUNTER — Encounter: Payer: Self-pay | Admitting: Cardiology

## 2022-05-22 ENCOUNTER — Other Ambulatory Visit: Payer: Self-pay

## 2022-05-22 MED ORDER — ATORVASTATIN CALCIUM 80 MG PO TABS: 80.0000 mg | ORAL_TABLET | Freq: Every day | ORAL | 1 refills | Status: DC

## 2022-05-22 MED ORDER — RANOLAZINE ER 1000 MG PO TB12: 1000.0000 mg | ORAL_TABLET | Freq: Two times a day (BID) | ORAL | 1 refills | Status: DC

## 2022-05-22 MED ORDER — FUROSEMIDE 40 MG PO TABS: 40.0000 mg | ORAL_TABLET | Freq: Every day | ORAL | 1 refills | Status: DC

## 2022-06-25 ENCOUNTER — Other Ambulatory Visit: Payer: Self-pay | Admitting: Cardiology

## 2022-06-25 DIAGNOSIS — I5020 Unspecified systolic (congestive) heart failure: Secondary | ICD-10-CM

## 2022-07-31 ENCOUNTER — Other Ambulatory Visit: Payer: Self-pay

## 2022-07-31 MED ORDER — PANTOPRAZOLE SODIUM 40 MG PO TBEC: 40.0000 mg | DELAYED_RELEASE_TABLET | Freq: Every day | ORAL | 3 refills | Status: DC

## 2022-09-19 ENCOUNTER — Other Ambulatory Visit: Payer: Self-pay | Admitting: Cardiology

## 2022-09-19 DIAGNOSIS — I5020 Unspecified systolic (congestive) heart failure: Secondary | ICD-10-CM

## 2022-10-17 ENCOUNTER — Other Ambulatory Visit: Payer: Self-pay | Admitting: Cardiology

## 2022-10-17 DIAGNOSIS — I5020 Unspecified systolic (congestive) heart failure: Secondary | ICD-10-CM

## 2022-11-12 ENCOUNTER — Other Ambulatory Visit: Payer: Self-pay | Admitting: Cardiology

## 2022-11-12 DIAGNOSIS — I5020 Unspecified systolic (congestive) heart failure: Secondary | ICD-10-CM

## 2023-01-17 ENCOUNTER — Other Ambulatory Visit: Payer: Self-pay

## 2023-01-17 DIAGNOSIS — I5020 Unspecified systolic (congestive) heart failure: Secondary | ICD-10-CM

## 2023-01-17 MED ORDER — CARVEDILOL 6.25 MG PO TABS: 6.2500 mg | ORAL_TABLET | Freq: Two times a day (BID) | ORAL | 1 refills | Status: DC

## 2023-03-03 ENCOUNTER — Encounter: Payer: Self-pay | Admitting: Cardiology

## 2023-03-03 ENCOUNTER — Ambulatory Visit: Payer: 59 | Admitting: Cardiology

## 2023-03-03 VITALS — BP 155/88 | HR 72 | Resp 16 | Ht 72.0 in | Wt 271.2 lb

## 2023-03-03 DIAGNOSIS — I25118 Atherosclerotic heart disease of native coronary artery with other forms of angina pectoris: Secondary | ICD-10-CM

## 2023-03-03 DIAGNOSIS — E782 Mixed hyperlipidemia: Secondary | ICD-10-CM

## 2023-03-03 DIAGNOSIS — I5020 Unspecified systolic (congestive) heart failure: Secondary | ICD-10-CM

## 2023-03-03 NOTE — Progress Notes (Signed)
Follow up visit  Subjective:   Darrell Simpson, male    DOB: May 23, 1951, 72 y.o.   MRN: [id number]   HPI   Chief Complaint  Patient presents with   Coronary Artery Disease   Follow-up    86 year   72 year old Caucasian male with hypertension, multivessel CAD, deemed not CABG candidate, HFrEF, former >60 PY smoker, CKD stage 3a, h/o squamous cell skin cancer, h/o COVID pneumonia in 11/2020 with residual fibrotic changes, possible interstitial lung disease  He has regular follow-up for interstitial lung disease with pulmonologist Dr. Brendolyn Patty.  He recently underwent MRI in [place] for his lungs, details not available to me.  He denies any chest pain.  He continues to have unchanged exertional dyspnea symptoms.  Blood pressure slightly elevated today, but generally well-controlled.  He sees his PCP once a month.   Current Outpatient Medications:    albuterol (VENTOLIN HFA) 108 (90 Base) MCG/ACT inhaler, Inhale 2 puffs into the lungs every 4 (four) hours as needed for wheezing or shortness of breath., Disp: , Rfl:    aspirin 81 MG EC tablet, Take 1 tablet (81 mg total) by mouth daily. Swallow whole., Disp: 30 tablet, Rfl: 11   atorvastatin (LIPITOR) 80 MG tablet, TAKE 1 TABLET BY MOUTH ONCE DAILY, Disp: 90 tablet, Rfl: 1   carvedilol (COREG) 6.25 MG tablet, Take 1 tablet (6.25 mg total) by mouth 2 (two) times daily., Disp: 60 tablet, Rfl: 1   Cyanocobalamin (VITAMIN B 12) 500 MCG TABS, Take by mouth as needed. Pt takes 1-2 x a month, Disp: , Rfl:    furosemide (LASIX) 40 MG tablet, TAKE 1 TABLET BY MOUTH ONCE DAILY, Disp: 90 tablet, Rfl: 1   HYDROcodone-acetaminophen (NORCO) 10-325 MG tablet, Take 1 tablet by mouth every 4 (four) hours as needed for moderate pain., Disp: , Rfl:    ipratropium-albuterol (DUONEB) 0.5-2.5 (3) MG/3ML SOLN, Take 3 mLs by nebulization 4 (four) times daily., Disp: 360 mL, Rfl: 1   nitroGLYCERIN (NITROSTAT) 0.4 MG SL tablet, Place 1 tablet (0.4 mg total) under the  tongue every 5 (five) minutes x 3 doses as needed for chest pain., Disp: 30 tablet, Rfl: 1   OFEV 100 MG CAPS, Take by mouth., Disp: , Rfl:    pantoprazole (PROTONIX) 40 MG tablet, Take 1 tablet (40 mg total) by mouth daily., Disp: 90 tablet, Rfl: 3   ranolazine (RANEXA) 1000 MG SR tablet, TAKE 1 TABLET BY MOUTH TWICE DAILY, Disp: 180 tablet, Rfl: 1   sacubitril-valsartan (ENTRESTO) 24-26 MG, TAKE 1 TABLET BY MOUTH 2 TIMES DAILY, Disp: 180 tablet, Rfl: 1   Vitamin D, Ergocalciferol, (DRISDOL) 1.25 MG (50000 UNIT) CAPS capsule, Take 50,000 Units by mouth once a week., Disp: , Rfl:   Cardiovascular & other pertient studies:  EKG 03/03/2023: Sinus rhythm 78 bpm  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 Left ventricle cavity  is normal in size. Mild left ventricular hypertrophy. Normal global wall  motion. Doppler evidence of grade I (impaired) diastolic dysfunction,  normal LAP.  Mild tricuspid regurgitation. No evidence of pulmonary hypertension.  Compared to 04/20/2021 no significant change.  Coronary intervention 01/30/2021: LM: Distal LM 40% stenosis LAD: Prox occlusion LCx: Severe calcification throughout the vessel         Prox calcific 80% stenosis         OM1 Ostial 60%, followed by prox 90% stenosis   Successful percutaneous coronary intervention  OM1 Orbital atherectomy, PTCA and stent placement 3.5 X 34 mm Resolute Onyx drug-eluting stent Attempted percutaneous intervention LCx. Unable to advance stent. Non-flow limiting dissection in prox LCx     There is TIMI III flow down LCx and OM. Patient is chest pain free with no EKG or hemodynamic changes. At this point, I decided to abort the procedure, as further attempts would likely increase risk of complications. Will monitor overnight, and consider repeat attempt in a few weeks.    Coronary intervention 01/15/2021: Attempted angioplasty proximal  RCA. Unsuccessful attempt. No complications.   Coronary angiography 01/12/2021: LM: Distal calcific 50% stenosis LAD: Prox CTO with weak left-to-left collaterals from LCx/OM LCx: Severe calcification throughout the vessel         Prox calcific 80% stenosis         OM1 Ostial 60%, followed by prox 90% stenosis RCA: Prox calcific 90% tubular stenosis         Mid 40% stenosis   LVEDP 1 mmHg   Severe calcific multivessel CAD Patient's IV NTG was held due to hypotension. However, it was resumed at 20 mcg/min due to chest pain. Chest pain improved.    CT surgery consulted for consideration for CABG  Recent labs: 09/19/2021: Chol 106, TG 274, HDL 35, LDL 30  03/19/2021: Glucose 84, BUN/Cr 18/1.42. EGFR 53. Na/K 139/4.0.   01/16/2021: Glucose 100, BUN/Cr 27/1.86. EGFR 39. Na/K 130/4.0. Albumin 3.0. AST 72. Rest of the CMP normal H/H 13/37. MCV 91. Platelets 204 HbA1C 5.3% Chol 179, TG 107, HDL 49, LDL 109 TSH N/A    Review of Systems  Cardiovascular:  Positive for dyspnea on exertion (Improving). Negative for chest pain, leg swelling, palpitations and syncope.       Vitals:   03/03/23 1506  BP: (!) 155/88  Pulse: 72  Resp: 16  SpO2: 94%      Body mass index is 36.78 kg/m. Filed Weights   03/03/23 1506  Weight: 271 lb 3.2 oz (123 kg)      Objective:   Physical Exam Vitals and nursing note reviewed.  Constitutional:      General: He is not in acute distress. Neck:     Vascular: No JVD.  Cardiovascular:     Rate and Rhythm: Normal rate and regular rhythm.     Heart sounds: Normal heart sounds. No murmur heard. Pulmonary:     Effort: Pulmonary effort is normal.     Breath sounds: Normal breath sounds. No wheezing or rales.  Musculoskeletal:     Right lower leg: Edema (Trace) present.     Left lower leg: Edema (Trace) present.       ICD-10-CM   1. Coronary artery disease involving native coronary artery of native heart with other form of angina pectoris   I25.118 EKG 12-Lead    2. HFrEF (heart failure with reduced ejection fraction)  I50.20     3. Mixed hyperlipidemia  E78.2      Orders Placed This Encounter  Procedures   EKG 12-Lead   There are no discontinued medications.        Assessment & Recommendations:   72 y/o Caucasian male with hypertension, multivessel CAD, deemed not CABG candidate, HFrEF, former >60 PY smoker, CKD stage 3a, h/o squamous cell skin cancer, h/o COVID pneumonia in 11/2020 with residual fibrotic changes, possible interstitial lung disease  CAD: Multivessel CAD with LAD CTO.  Severe proximal RCA and distal LM/proximal LCx/OM1 disease (01/2021) Not a surgical candidate.  Unsuccessful revascularization  attempt of proximal RCA (01/2021) Successful atherectomy and PCI to OM (01/2021).  Unable to deliver stent to circumflex with nonflow limiting dissection. However, clinically, patient is doing well without overt angina. Dyspnea has also improve.d He is now also on treatment for pulmonary fibrosis. Continue Aspirin 81 mg daily. Continue Lipitor 80 mg daily, carvedilol 6.25 mg twice daily, Ranexa 1000 mg twice daily. Will get labs from PCP, hopefully that includes lipid panel. Okay to stop pantoprazole given that he is no longer on dual antiplatelet therapy.  HFrEF: Recovered LVEF, 50-55% (02/2022) Clinically euvolumic. NYHA class I-II. Continue current medical therapy. Continue Entresto 24-26 mg twice daily, carvedilol 6.25 mg bid.  F/u in 1 year   Elder Negus, MD Pager: [phone] Office: [phone]

## 2023-04-03 ENCOUNTER — Other Ambulatory Visit: Payer: Self-pay | Admitting: Cardiology

## 2023-04-03 DIAGNOSIS — I5020 Unspecified systolic (congestive) heart failure: Secondary | ICD-10-CM

## 2023-04-29 ENCOUNTER — Other Ambulatory Visit: Payer: Self-pay | Admitting: Cardiology

## 2023-05-01 ENCOUNTER — Other Ambulatory Visit: Payer: Self-pay | Admitting: Cardiology

## 2023-05-03 ENCOUNTER — Other Ambulatory Visit: Payer: Self-pay | Admitting: Cardiology

## 2023-06-27 ENCOUNTER — Encounter: Payer: Self-pay | Admitting: Cardiology

## 2023-07-28 ENCOUNTER — Other Ambulatory Visit: Payer: Self-pay | Admitting: Cardiology

## 2023-07-30 ENCOUNTER — Other Ambulatory Visit: Payer: Self-pay | Admitting: Cardiology

## 2023-10-22 ENCOUNTER — Other Ambulatory Visit: Payer: Self-pay | Admitting: Cardiology

## 2024-03-03 ENCOUNTER — Ambulatory Visit: Payer: Self-pay | Admitting: Cardiology

## 2024-04-21 ENCOUNTER — Other Ambulatory Visit: Payer: Self-pay | Admitting: Cardiology

## 2024-04-21 DIAGNOSIS — I5020 Unspecified systolic (congestive) heart failure: Secondary | ICD-10-CM

## 2024-05-21 ENCOUNTER — Other Ambulatory Visit: Payer: Self-pay | Admitting: Cardiology

## 2024-05-21 DIAGNOSIS — I5020 Unspecified systolic (congestive) heart failure: Secondary | ICD-10-CM

## 2024-06-10 ENCOUNTER — Ambulatory Visit: Admitting: Cardiology

## 2024-06-11 ENCOUNTER — Encounter: Payer: Self-pay | Admitting: Cardiology

## 2024-06-11 ENCOUNTER — Ambulatory Visit: Admitting: Cardiology

## 2024-06-11 NOTE — Telephone Encounter (Signed)
 Contacted patient and he advised that around 1 hour after taking his carvedilol  dose he is noticing heart rates decreasing to the 30's. Pt admits that this morning after carvedilol  dose his heart rate dropped to 32. Pt reports that symptoms of shortness of breath on exertion and fatigue during bradycardia. Pt admits heart maintains low for 2-3 hours and then will return back to normal and is asymptomatic. Spoke with Dr. Tolia and pt advised to hold carvedilol  doses this weekend and contact office Monday to advise status. Per provider pt advised if heart rate maintains in the 30's or becomes symptomatic again then to go to the emergency room. Pt agrees with plan of care.

## 2024-06-11 NOTE — Telephone Encounter (Signed)
 Appointment made for patient on Monday with Dr. Elmira.

## 2024-06-14 ENCOUNTER — Encounter: Payer: Self-pay | Admitting: Cardiology

## 2024-06-14 ENCOUNTER — Ambulatory Visit: Attending: Cardiology | Admitting: Cardiology

## 2024-06-14 ENCOUNTER — Ambulatory Visit

## 2024-06-14 ENCOUNTER — Other Ambulatory Visit (HOSPITAL_COMMUNITY): Payer: Self-pay

## 2024-06-14 VITALS — BP 158/98 | HR 76 | Ht 72.0 in | Wt 259.5 lb

## 2024-06-14 DIAGNOSIS — I5020 Unspecified systolic (congestive) heart failure: Secondary | ICD-10-CM | POA: Diagnosis not present

## 2024-06-14 DIAGNOSIS — E782 Mixed hyperlipidemia: Secondary | ICD-10-CM

## 2024-06-14 DIAGNOSIS — I25118 Atherosclerotic heart disease of native coronary artery with other forms of angina pectoris: Secondary | ICD-10-CM | POA: Diagnosis not present

## 2024-06-14 DIAGNOSIS — R001 Bradycardia, unspecified: Secondary | ICD-10-CM | POA: Diagnosis not present

## 2024-06-14 DIAGNOSIS — R600 Localized edema: Secondary | ICD-10-CM | POA: Insufficient documentation

## 2024-06-14 DIAGNOSIS — R0609 Other forms of dyspnea: Secondary | ICD-10-CM | POA: Insufficient documentation

## 2024-06-14 MED ORDER — FUROSEMIDE 40 MG PO TABS
40.0000 mg | ORAL_TABLET | Freq: Every day | ORAL | 3 refills | Status: DC
Start: 2024-06-14 — End: 2024-08-13
  Filled 2024-06-14: qty 90, 90d supply, fill #0

## 2024-06-14 NOTE — Telephone Encounter (Signed)
 I did discuss this with the patient today, but happy to recap. We increased lasix  from 20 mg to 40 mg daily. Based on his labs, I will likely add further medications that could help get rid of excess fluid as well as control blood pressure better, but I need to see the labs first. I will be in touch with him couple days.  Thanks MJP

## 2024-06-14 NOTE — Patient Instructions (Signed)
 Medication Instructions:  STOP Carvedilol   INCREASE Lasix  to 40 mg daily  *If you need a refill on your cardiac medications before your next appointment, please call your pharmacy*  Lab Work: Lipid panel BMP PROBNP   If you have labs (blood work) drawn today and your tests are completely normal, you will receive your results only by: MyChart Message (if you have MyChart) OR A paper copy in the mail If you have any lab test that is abnormal or we need to change your treatment, we will call you to review the results.  Testing/Procedures: 2 week zio  Your physician has requested that you wear a Zio heart monitor for __14___ days. This will be mailed to your home with instructions on how to apply the monitor and how to return it when finished. Please allow 2 weeks after returning the heart monitor before our office calls you with the results.   ECHO  Your physician has requested that you have an echocardiogram. Echocardiography is a painless test that uses sound waves to create images of your heart. It provides your doctor with information about the size and shape of your heart and how well your heart's chambers and valves are working. This procedure takes approximately one hour. There are no restrictions for this procedure. Please do NOT wear cologne, perfume, aftershave, or lotions (deodorant is allowed). Please arrive 15 minutes prior to your appointment time.  Please note: We ask at that you not bring children with you during ultrasound (echo/ vascular) testing. Due to room size and safety concerns, children are not allowed in the ultrasound rooms during exams. Our front office staff cannot provide observation of children in our lobby area while testing is being conducted. An adult accompanying a patient to their appointment will only be allowed in the ultrasound room at the discretion of the ultrasound technician under special circumstances. We apologize for any  inconvenience.   PET/CT  How to Prepare for Your Cardiac PET/CT Stress Test:  1. Please do not take these medications before your test:  ~Medications that may interfere with the cardiac pharmacological stress agent (ex. nitrates - including erectile dysfunction medications, isosorbide  mononitrate, tamulosin or beta-blockers) the day of the exam. (Erectile dysfunction medication should be held for at least 72 hrs prior to test) ~Theophylline containing medications for 12 hours. ~Dipyridamole 48 hours prior to the test. ~Your remaining medications may be taken with water.  2. Nothing to eat or drink, except water, 3 hours prior to arrival time.   ~ NO caffeine/decaffeinated products, or chocolate 12 hours prior to arrival.  3. NO perfume, cologne or lotion on chest or abdomen area.         - FEMALES - Please avoid wearing dresses to this appointment.  4. Total time is 1 to 2 hours; you may want to bring reading material for the waiting time.  Please report to Radiology at the [facility] Main Entrance 30 minutes early for your test. [address] [place], [place] [address]  Diabetic Preparation: - Hold oral medications. - You may take NPH and Lantus insulin. - Do not take Humalog or Humulin R (Regular Insulin) the day of your test. - Check blood sugars prior to leaving the house. - If able to eat breakfast prior to 3 hour fasting, you may take all medications, including your insulin, - Do not worry if you miss your breakfast dose of insulin - start at your next meal. - Patients who wear a continuous glucose  monitor MUST remove the device prior to scanning.  IF YOU THINK YOU MAY BE PREGNANT, OR ARE NURSING PLEASE INFORM THE TECHNOLOGIST.  In preparation for your appointment, medication and supplies will be purchased.  Appointment availability is limited, so if you need to cancel or reschedule, please call the Radiology Department at [phone] [facility] Law) OR  [phone] [facility])  24 hours in advance to avoid a cancellation fee of $100.00  What to Expect After you Arrive:  Once you arrive and check in for your appointment, you will be taken to a preparation room within the Radiology Department.  A technologist or Nurse will obtain your medical history, verify that you are correctly prepped for the exam, and explain the procedure.  Afterwards,  an IV will be started in your arm and electrodes will be placed on your skin for EKG monitoring during the stress portion of the exam. Then you will be escorted to the PET/CT scanner.  There, staff will get you positioned on the scanner and obtain a blood pressure and EKG.  During the exam, you will continue to be connected to the EKG and blood pressure machines.  A small, safe amount of a radioactive tracer will be injected in your IV to obtain a series of pictures of your heart along with an injection of a stress agent.    After your Exam:  It is recommended that you eat a meal and drink a caffeinated beverage to counter act any effects of the stress agent.  Drink plenty of fluids for the remainder of the day and urinate frequently for the first couple of hours after the exam.  Your doctor will inform you of your test results within 7-10 business days.  For more information and frequently asked questions, please visit our website : [email or web address]  For questions about your test or how to prepare for your test, please call: Cardiac Imaging Nurse Navigators Office: [phone]    Follow-Up: At [facility], you and your health needs are our priority.  As part of our continuing mission to provide you with exceptional heart care, our providers are all part of one team.  This team includes your primary Cardiologist (physician) and Advanced Practice Providers or APPs (Physician Assistants and Nurse Practitioners) who all work together to provide you with the care you need, when you need  it.  Your next appointment:   8 week(s)  Provider:   Newman JINNY Lawrence, MD    We recommend signing up for the patient portal called MyChart.  Sign up information is provided on this After Visit Summary.  MyChart is used to connect with patients for Virtual Visits (Telemedicine).  Patients are able to view lab/test results, encounter notes, upcoming appointments, etc.  Non-urgent messages can be sent to your provider as well.   To learn more about what you can do with MyChart, go to [email or web address].   Other Instructions ZIO XT- Long Term Monitor Instructions  Your physician has requested you wear a ZIO patch monitor for ____ days.   This is a single patch monitor. [employer] supplies one patch monitor per enrollment. Additional  stickers are not available. Please do not apply patch if you will be having a Nuclear Stress Test,  Echocardiogram, Cardiac CT, MRI, or Chest Xray during the period you would be wearing the  monitor. The patch cannot be worn during these tests. You cannot remove and re-apply the  ZIO XT patch monitor.   Your ZIO patch monitor will be mailed  3 day USPS to your address on file. It may take 3-5 days  to receive your monitor after you have been enrolled.   Once you have received your monitor, please review the enclosed instructions. Your monitor  has already been registered assigning a specific monitor serial # to you.     Billing and Patient Assistance Program Information  We have supplied [employer] with any of your insurance information on file for billing purposes.  [employer] offers a sliding scale Patient Assistance Program for patients that do not have  insurance, or whose insurance does not completely cover the cost of the ZIO monitor.  You must apply for the Patient Assistance Program to qualify for this discounted rate.   To apply, please call [employer] at [phone], select option 4, select option 2, ask to apply for  Patient Assistance Program.  Meredeth will ask your household income, and how many people  are in your household. They will quote your out-of-pocket cost based on that information.  [employer] will also be able to set up a 82-month, interest-free payment plan if needed.     Applying the monitor  Shave hair from upper left chest.  Hold abrader disc by orange tab. Rub abrader in 40 strokes over the upper left chest as  indicated in your monitor instructions.  Clean area with 4 enclosed alcohol pads. Let dry.  Apply patch as indicated in monitor instructions. Patch will be placed under collarbone on left  side of chest with arrow pointing upward.  Rub patch adhesive wings for 2 minutes. Remove white label marked 1. Remove the white  label marked 2. Rub patch adhesive wings for 2 additional minutes.  While looking in a mirror, press and release button in center of patch. A small green light will  flash 3-4 times. This will be your only indicator that the monitor has been turned on.  Do not shower for the first 24 hours. You may shower after the first 24 hours.  Press the button if you feel a symptom. You will hear a small click. Record Date, Time and  Symptom in the Patient Logbook.  When you are ready to remove the patch, follow instructions on the last 2 pages of Patient  Logbook. Stick patch monitor onto the last page of Patient Logbook.   Place Patient Logbook in the blue and white box. Use locking tab on box and tape box closed  securely. The blue and white box has prepaid postage on it. Please place it in the mailbox as  soon as possible. Your physician should have your test results approximately 7 days after the  monitor has been mailed back to [facility].   Call [facility] Customer Care at [phone] if you have questions regarding  your ZIO XT patch monitor. Call them immediately if you see an orange light blinking on your  monitor.   If your monitor falls off in less than 4 days, contact our  Monitor department at [phone].   If your monitor becomes loose or falls off after 4 days call [employer] at [phone] for  suggestions on securing your monitor.

## 2024-06-14 NOTE — Progress Notes (Unsigned)
 ZIO serial # [id number] from office inventory applied to patient.

## 2024-06-14 NOTE — Progress Notes (Unsigned)
 error

## 2024-06-14 NOTE — Progress Notes (Signed)
 Cardiology Office Note:  .   Date:  06/14/2024  ID:  Darrell Simpson, DOB Apr 29, 1951, MRN [id number] PCP: Norval Kettle, MD  [facility] Providers Cardiologist:  Newman Lawrence, MD PCP: Norval Kettle, MD  Chief Complaint  Patient presents with   Bradycardia   Coronary artery disease involving native coronary artery of     Darrell Simpson is a 73 y.o. male with hypertension, multivessel CAD, HFrEF, former >60 PY smoker, CKD stage 3a, h/o squamous cell skin cancer, exertional dyspnea  History of Present Illness  I saw the patient in 02/2023.  Recently, patient has noticed bradycardia in 30s. Coreg  was stopped. Heart rate has improved since then. However, he has had worsening exertional dyspnea and leg edema recently. He is on 2 L supplemental oxygen . He is seeing Dr. McQuaid for second opinion whether he has pulmonary fibrosis or not. He denies chest pain. Blood pressure is elevated since stopping coreg ,   Vitals:   06/14/24 1118  BP: (!) 158/98  Pulse: 76  SpO2: 96%      Review of Systems  Cardiovascular:  Positive for dyspnea on exertion and leg swelling. Negative for chest pain, palpitations and syncope.        Studies Reviewed: SABRA        EKG 06/14/2024: Sinus rhythm with 1st degree A-V block Left anterior fascicular block Minimal voltage criteria for LVH, may be normal variant ( Cornell product ) Septal infarct (cited on or before 30-Jan-2021) When compared with ECG of 31-Jan-2021 06:30, Questionable change in initial forces of Lateral leads Nonspecific T wave abnormality no longer evident in Anterior leads QT has lengthened   Coronary intervention 2022: LM: Distal LM 40% stenosis LAD: Prox occlusion LCx: Severe calcification throughout the vessel         Prox calcific 80% stenosis         OM1 Ostial 60%, followed by prox 90% stenosis   Successful percutaneous coronary intervention OM1 Orbital atherectomy, PTCA and stent placement 3.5 X 34 mm Resolute Onyx  drug-eluting stent Attempted percutaneous intervention LCx. Unable to advance stent. Non-flow limiting dissection in prox Lcx  Echocardiogram 2023: Normal LV systolic function with visual EF 50-55%. Left ventricle cavity is normal in size. Mild left ventricular hypertrophy. Normal global wall motion. Doppler evidence of grade I (impaired) diastolic dysfunction, normal LAP. Mild tricuspid regurgitation. No evidence of pulmonary hypertension. Compared to 04/20/2021 no significant change.    Physical Exam Vitals and nursing note reviewed.  Constitutional:      General: He is not in acute distress. Neck:     Vascular: No JVD.  Cardiovascular:     Rate and Rhythm: Normal rate and regular rhythm.     Heart sounds: Normal heart sounds. No murmur heard. Pulmonary:     Effort: Pulmonary effort is normal.     Breath sounds: Rales present. No wheezing.  Musculoskeletal:     Right lower leg: Edema (1+) present.     Left lower leg: Edema (1+) present.      VISIT DIAGNOSES:   ICD-10-CM   1. HFrEF (heart failure with reduced ejection fraction) (HCC)  I50.20 EKG 12-Lead    2. Mixed hyperlipidemia  E78.2 EKG 12-Lead    Lipid panel    3. Coronary artery disease involving native coronary artery of native heart with other form of angina pectoris (HCC)  I25.118 EKG 12-Lead    4. Bradycardia  R00.1 EKG 12-Lead    LONG TERM MONITOR (3-14 DAYS)  5. Exertional dyspnea  R06.09 NM PET CT CARDIAC PERFUSION MULTI W/ABSOLUTE BLOODFLOW    Basic metabolic panel with GFR    Pro b natriuretic peptide (BNP)    ECHOCARDIOGRAM COMPLETE    Cardiac Stress Test: Informed Consent Details: Physician/Practitioner Attestation; Transcribe to consent form and obtain patient signature    6. Leg edema  R60.0 Basic metabolic panel with GFR    Pro b natriuretic peptide (BNP)    ECHOCARDIOGRAM COMPLETE       Darrell Simpson is a 73 y.o. male with hypertension, multivessel CAD, HFrEF, former >60 PY smoker, CKD  stage 3a, h/o squamous cell skin cancer, h/o COVID pneumonia in 11/2020 with residual fibrotic changes, possible interstitial lung disease  Assessment & Plan  CAD: Multivessel CAD with LAD CTO.  Severe proximal RCA and distal LM/proximal LCx/OM1 disease (01/2021) Not a surgical candidate.  Unsuccessful revascularization attempt of proximal RCA (01/2021) Successful atherectomy and PCI to OM (01/2021).  Unable to deliver stent to circumflex with nonflow limiting dissection. However, clinically, patient is doing well without overt angina. Dyspnea has also improve.d He is now also on treatment for pulmonary fibrosis. Continue Aspirin  81 mg daily. Continue Lipitor  80 mg daily, Ranexa  1000 mg twice daily.   HFrEF: Recent exertional dyspnea concerning for HFrEF. Check echocardiogram, PET/CT stress test to evaluate for ischemia.  Continue current medical therapy. Continue Entresto  24-26 mg twice daily, may need to uptitrate after checking BMP.  Exertional dyspnea: Possibly also component pg pulmonary pathology. Continue f/u w/Dr. McQuaid.      Informed Consent   Shared Decision Making/Informed Consent The risks [chest pain, shortness of breath, cardiac arrhythmias, dizziness, blood pressure fluctuations, myocardial infarction, stroke/transient ischemic attack, nausea, vomiting, allergic reaction, radiation exposure, metallic taste sensation and life-threatening complications (estimated to be 1 in 10,000)], benefits (risk stratification, diagnosing coronary artery disease, treatment guidance) and alternatives of a nuclear stress test were discussed in detail with [name] and he agrees to proceed.       Meds ordered this encounter  Medications   furosemide  (LASIX ) 40 MG tablet    Sig: Take 1 tablet (40 mg total) by mouth daily.    Dispense:  90 tablet    Refill:  3     F/u in 6-8 weeks  Signed, Newman JINNY Lawrence, MD

## 2024-06-15 ENCOUNTER — Other Ambulatory Visit: Payer: Self-pay | Admitting: Cardiology

## 2024-06-15 ENCOUNTER — Ambulatory Visit: Payer: Self-pay | Admitting: Cardiology

## 2024-06-15 DIAGNOSIS — R899 Unspecified abnormal finding in specimens from other organs, systems and tissues: Secondary | ICD-10-CM

## 2024-06-15 DIAGNOSIS — Z79899 Other long term (current) drug therapy: Secondary | ICD-10-CM

## 2024-06-15 DIAGNOSIS — I5020 Unspecified systolic (congestive) heart failure: Secondary | ICD-10-CM

## 2024-06-15 LAB — BASIC METABOLIC PANEL WITH GFR
BUN/Creatinine Ratio: 14 (ref 10–24)
BUN: 24 mg/dL (ref 8–27)
CO2: 16 mmol/L — ABNORMAL LOW (ref 20–29)
Calcium: 8.5 mg/dL — ABNORMAL LOW (ref 8.6–10.2)
Chloride: 105 mmol/L (ref 96–106)
Creatinine, Ser: 1.73 mg/dL — ABNORMAL HIGH (ref 0.76–1.27)
Glucose: 85 mg/dL (ref 70–99)
Potassium: 5.2 mmol/L (ref 3.5–5.2)
Sodium: 138 mmol/L (ref 134–144)
eGFR: 41 mL/min/1.73 — ABNORMAL LOW (ref >59)

## 2024-06-15 LAB — LIPID PANEL
Chol/HDL Ratio: 2.7 ratio (ref 0.0–5.0)
Cholesterol, Total: 115 mg/dL (ref 100–199)
HDL: 43 mg/dL (ref >39)
LDL Chol Calc (NIH): 51 mg/dL (ref 0–99)
Triglycerides: 113 mg/dL (ref 0–149)
VLDL Cholesterol Cal: 21 mg/dL (ref 5–40)

## 2024-06-15 LAB — PRO B NATRIURETIC PEPTIDE: NT-Pro BNP: 1302 pg/mL — ABNORMAL HIGH (ref 0–376)

## 2024-06-15 NOTE — Progress Notes (Signed)
 Kidney function slightly lower than 2 years ago.  Marker of heart failure congestion significantly elevated. Continue Lasix  40 mg daily.  Recommend adding Farxiga  10 mg daily. Hold carvedilol  for now. Diuresis may help with improving blood pressure.  Given that potassium is slightly elevated at 5.2, I would not start Entresto  or spironolactone at this time, which are medications that can help blood pressure as well as heart function.  Recommend repeating BMP and proBNP in 1 week.  Thanks MJP

## 2024-06-15 NOTE — Telephone Encounter (Signed)
 Hold carvedilol  for now given concern for bradycardia recently.  Thanks MJP

## 2024-06-16 MED ORDER — DAPAGLIFLOZIN PROPANEDIOL 10 MG PO TABS: 10.0000 mg | ORAL_TABLET | Freq: Every day | ORAL | 4 refills | Status: DC

## 2024-06-24 ENCOUNTER — Encounter: Payer: Self-pay | Admitting: Cardiology

## 2024-06-28 NOTE — Telephone Encounter (Signed)
 Both PET/CT and nuclear stress testing use radioisotope.  PET/CT is probably more sensitive and specific PET nuclear stress test.  I am okay with either one of these tests that patient chooses, whichever can be done earlier.   Thanks MJP

## 2024-07-01 ENCOUNTER — Ambulatory Visit: Admitting: Cardiology

## 2024-07-09 DIAGNOSIS — R001 Bradycardia, unspecified: Secondary | ICD-10-CM

## 2024-07-09 MED ORDER — CARVEDILOL 6.25 MG PO TABS: 3.1250 mg | ORAL_TABLET | Freq: Two times a day (BID) | ORAL | Status: DC

## 2024-07-09 NOTE — Addendum Note (Signed)
 Addended by: MANDA LYLE NOVAK on: 07/09/2024 06:32 PM   Modules accepted: Orders

## 2024-07-09 NOTE — Progress Notes (Signed)
 Multiple episodes of rapid heartbeat originating from top chamber of the heart noted.  Longest episode lasted for 2-minute 44 seconds.  Stopping of carvedilol  may have precipitated the symptoms.  I did not see any slow heart rate episodes on this monitor.  Recommend resuming carvedilol  at half tablet twice daily, if possible.  If cutting the pill in half is not possible, okay to take 1 pill twice daily.  Thanks MJP

## 2024-07-21 ENCOUNTER — Ambulatory Visit (HOSPITAL_COMMUNITY)
Admission: RE | Admit: 2024-07-21 | Discharge: 2024-07-21 | Disposition: A | Discharge status: Home / Self Care | Source: Ambulatory Visit | Attending: Cardiology | Admitting: Cardiology

## 2024-07-21 DIAGNOSIS — R600 Localized edema: Secondary | ICD-10-CM | POA: Diagnosis present

## 2024-07-21 DIAGNOSIS — R0609 Other forms of dyspnea: Secondary | ICD-10-CM | POA: Diagnosis present

## 2024-07-21 LAB — ECHOCARDIOGRAM COMPLETE
Area-P 1/2: 3.17 cm2
S' Lateral: 4.1 cm

## 2024-07-21 MED ORDER — PERFLUTREN LIPID MICROSPHERE
1.0000 mL | INTRAVENOUS | Status: AC | PRN
  Administered 2024-07-21: 4 mL via INTRAVENOUS

## 2024-07-22 NOTE — Progress Notes (Signed)
 LVEF is lower than before.  Currently scheduled to see me in early October.  I would like to see him next week for follow-up, if possible.  Thanks MJP

## 2024-07-23 NOTE — Progress Notes (Signed)
 May not have any sooner availability. Okay to keep 10/3.  Thanks MJP

## 2024-07-26 ENCOUNTER — Encounter (HOSPITAL_COMMUNITY): Payer: Self-pay

## 2024-07-26 ENCOUNTER — Telehealth (HOSPITAL_COMMUNITY): Payer: Self-pay | Admitting: *Deleted

## 2024-07-26 NOTE — Telephone Encounter (Signed)
 Reaching out to patient to offer assistance regarding upcoming cardiac imaging study; pt verbalizes understanding of appt date/time, parking situation and where to check in, pre-test NPO status and medications ordered, and verified current allergies; name and call back number provided for further questions should they arise Sid Seats RN Navigator Cardiac Imaging [facility] [phone] office [phone] cell  Patient aware to avoid caffeine for 12 hours prior to test and hold ranolazine .

## 2024-07-27 ENCOUNTER — Encounter (HOSPITAL_COMMUNITY)
Admission: RE | Admit: 2024-07-27 | Discharge: 2024-07-27 | Disposition: A | Discharge status: Home / Self Care | Source: Ambulatory Visit | Attending: Cardiology | Admitting: Cardiology

## 2024-07-27 DIAGNOSIS — R0609 Other forms of dyspnea: Secondary | ICD-10-CM | POA: Insufficient documentation

## 2024-07-27 LAB — NM PET CT CARDIAC PERFUSION MULTI W/ABSOLUTE BLOODFLOW
MBFR: 1.02
Nuc Rest EF: 25 %
Nuc Stress EF: 29 %
Rest MBF: 0.87 ml/g/min
Rest Nuclear Isotope Dose: 29.9 mCi
ST Depression (mm): 0 mm
Stress MBF: 0.89 ml/g/min
Stress Nuclear Isotope Dose: 29.9 mCi
TID: 1.23

## 2024-07-27 MED ORDER — REGADENOSON 0.4 MG/5ML IV SOLN
0.4000 mg | Freq: Once | INTRAVENOUS | Status: AC
Start: 2024-07-27 — End: 2024-07-27
  Administered 2024-07-27: 0.4 mg via INTRAVENOUS

## 2024-07-27 MED ORDER — RUBIDIUM RB82 GENERATOR (RUBYFILL)
29.8700 | PACK | Freq: Once | INTRAVENOUS | Status: AC
Start: 2024-07-27 — End: 2024-07-27
  Administered 2024-07-27: 29.87 via INTRAVENOUS

## 2024-07-27 MED ORDER — REGADENOSON 0.4 MG/5ML IV SOLN
INTRAVENOUS | Status: AC
  Filled 2024-07-27: qty 5

## 2024-07-27 MED ORDER — RUBIDIUM RB82 GENERATOR (RUBYFILL)
29.8900 | PACK | Freq: Once | INTRAVENOUS | Status: AC
Start: 2024-07-27 — End: 2024-07-27
  Administered 2024-07-27: 29.89 via INTRAVENOUS

## 2024-07-27 NOTE — Progress Notes (Signed)
 Pt. Tolerated lexi scan well.

## 2024-07-28 ENCOUNTER — Other Ambulatory Visit (HOSPITAL_COMMUNITY): Payer: Self-pay

## 2024-07-28 ENCOUNTER — Ambulatory Visit: Attending: Cardiology | Admitting: Cardiology

## 2024-07-28 ENCOUNTER — Encounter: Payer: Self-pay | Admitting: Cardiology

## 2024-07-28 VITALS — BP 144/76 | HR 82 | Ht 72.0 in | Wt 269.2 lb

## 2024-07-28 DIAGNOSIS — I5020 Unspecified systolic (congestive) heart failure: Secondary | ICD-10-CM | POA: Diagnosis not present

## 2024-07-28 DIAGNOSIS — I25118 Atherosclerotic heart disease of native coronary artery with other forms of angina pectoris: Secondary | ICD-10-CM

## 2024-07-28 MED ORDER — CARVEDILOL 6.25 MG PO TABS
6.2500 mg | ORAL_TABLET | Freq: Two times a day (BID) | ORAL | 3 refills | Status: DC
  Filled 2024-07-28: qty 180, 90d supply, fill #0

## 2024-07-28 MED ORDER — NITROGLYCERIN 0.4 MG SL SUBL
0.4000 mg | SUBLINGUAL_TABLET | SUBLINGUAL | 1 refills | Status: DC | PRN
  Filled 2024-07-28: qty 25, 8d supply, fill #0

## 2024-07-28 NOTE — Progress Notes (Signed)
 Cardiology Office Note:  .   Date:  07/28/2024  ID:  Darrell Simpson, DOB [birth year], MRN [id number] PCP: Norval Kettle, MD  [facility] Providers Cardiologist:  Newman Lawrence, MD PCP: Norval Kettle, MD  Chief Complaint  Patient presents with   HFrEF     Darrell Simpson is a 73 y.o. male with hypertension, multivessel CAD, HFrEF, former >60 PY smoker, CKD stage 3a, h/o squamous cell skin cancer, exertional dyspnea  History of Present Illness  Patient was originally seen by me in 2022 and found to have multivessel CAD, including occluded prox LAD. Due to his medical comorbidities including lung disease, he was deemed not to be a surgical candidate for CABG. He had an unsuccessful attempt to PCI to RCA. He udnerwent successful orbital atherectomy, PTCA and stent placement 3.5 X 34 mm Resolute Onyx drug-eluting stent to OM1, attempted percutaneous intervention LCx. Unable to advance stent, led to non-flow limiting dissection in prox LCx  Recently, patient has noticed bradycardia in 30s. Coreg  was stopped. Heart rate has improved since then. However, he has had worsening exertional dyspnea and leg edema recently.  This has since improved after increasing Lasix  dose.  Leg edema is resolved, but continues to have exertional dyspnea with usual activity.  He occasionally also has chest pain with emotional stress.    Patient udnerwent echcoardiogram and stress test that showed significant abnormalities, details below.    Vitals:   07/28/24 1610  BP: (!) 144/76  Pulse: 82  SpO2: 93%       Review of Systems  Cardiovascular:  Positive for chest pain and dyspnea on exertion. Negative for leg swelling, palpitations and syncope.        Studies Reviewed: SABRA        EKG 07/28/2024: Sinus rhythm with 1st degree A-V block with occasional Premature ventricular complexes Left axis deviation Left ventricular hypertrophy with QRS widening and repolarization abnormality ( R in aVL , Cornell  product , Romhilt-Estes ) Cannot rule out Septal infarct (cited on or before 30-Jan-2021) When compared with ECG of 14-Jun-2024 11:17, Premature ventricular complexes are now Present    PET/CT stress test 07/2024:   Severe partially reversible perfusion defect in apical to mid anterior/anteroseptal wall consistent with with infarct with peri-infarct ischemia.  In addition there is partially reversible perfusion defect in inferior wall. LVEF is severely reduced (25%).  TID is present, which is a high risk finding.  Myocardial blood flow reserve is markedly abnormal (1.02), though accuracy can be affected by prior coronary stenting.  Overall, findings suggest severe multivessel CAD and study is high risk.  Cardiac catheterization recommended   LV perfusion is abnormal. There is evidence of ischemia. There is evidence of infarction. Defect 1: There is a medium defect with severe reduction in uptake present in the apical to mid anterior, anteroseptal and apex location(s) that is partially reversible. There is abnormal wall motion in the defect area. Consistent with peri-infarct ischemia. Defect 2: There is a medium defect with moderate reduction in uptake present in the apical to basal inferior and inferoseptal location(s) that is partially reversible. There is abnormal wall motion in the defect area. Consistent with peri-infarct ischemia.   Rest left ventricular function is abnormal. Rest global function is severely reduced. Rest EF: 25%. Stress left ventricular function is abnormal. Stress global function is severely reduced. Stress EF: 29%. End diastolic cavity size is moderately enlarged. End systolic cavity size is moderately enlarged.   Myocardial blood flow was computed  to be 0.87ml/g/min at rest and 0.89ml/g/min at stress. Global myocardial blood flow reserve was 1.02 and was highly abnormal.   Coronary calcium  assessment not performed due to prior revascularization.   Findings are consistent with  infarction with peri-infarct ischemia. The study is high risk.  Echocardiogram 07/2024:  1. Apical hypokinesis. Left ventricular ejection fraction, by estimation,  is 30 to 35%. The left ventricle has moderately decreased function. The  left ventricle demonstrates global hypokinesis. There is mild left  ventricular hypertrophy. Left ventricular diastolic parameters are consistent with Grade I diastolic dysfunction (impaired relaxation).   2. Right ventricular systolic function is mildly reduced. The right  ventricular size is normal.   3. The mitral valve is normal in structure. No evidence of mitral valve  regurgitation. No evidence of mitral stenosis.   4. The aortic valve is normal in structure. Aortic valve regurgitation is  mild. No aortic stenosis is present.   5. Aortic dilatation noted. There is mild dilatation of the ascending  aorta, measuring 42 mm.   6. The inferior vena cava is normal in size with greater than 50%  respiratory variability, suggesting right atrial pressure of 3 mmHg.   Coronary intervention 2022: LM: Distal LM 40% stenosis LAD: Prox occlusion LCx: Severe calcification throughout the vessel         Prox calcific 80% stenosis         OM1 Ostial 60%, followed by prox 90% stenosis   Successful percutaneous coronary intervention OM1 Orbital atherectomy, PTCA and stent placement 3.5 X 34 mm Resolute Onyx drug-eluting stent Attempted percutaneous intervention LCx. Unable to advance stent. Non-flow limiting dissection in prox Lcx  Echocardiogram 2023: Normal LV systolic function with visual EF 50-55%. Left ventricle cavity is normal in size. Mild left ventricular hypertrophy. Normal global wall motion. Doppler evidence of grade I (impaired) diastolic dysfunction, normal LAP. Mild tricuspid regurgitation. No evidence of pulmonary hypertension. Compared to 04/20/2021 no significant change.  Labs 06/14/2024: Chol 115, TG 113, HDL 43, LDL 51 Cr 1.73, eGFR 51, K  5.2 ProBNP 1302   Physical Exam Vitals and nursing note reviewed.  Constitutional:      General: He is not in acute distress. Neck:     Vascular: No JVD.  Cardiovascular:     Rate and Rhythm: Normal rate and regular rhythm.     Heart sounds: Normal heart sounds. No murmur heard. Pulmonary:     Effort: Pulmonary effort is normal.     Breath sounds: No wheezing or rales.  Musculoskeletal:     Right lower leg: No edema.     Left lower leg: No edema.      VISIT DIAGNOSES:   ICD-10-CM   1. HFrEF (heart failure with reduced ejection fraction) (HCC)  I50.20 EKG 12-Lead    Basic metabolic panel with GFR    CBC    Pro b natriuretic peptide (BNP)    Magnesium    2. Coronary artery disease of native artery of native heart with stable angina pectoris (HCC)  I25.118 EKG 12-Lead        Darrell Simpson is a 73 y.o. male with hypertension, multivessel CAD, HFrEF, former >60 PY smoker, CKD stage 3a, h/o squamous cell skin cancer, h/o COVID pneumonia in 11/2020 with residual fibrotic changes, possible interstitial lung disease  Assessment & Plan  CAD: Multivessel CAD with LAD CTO.  Severe proximal RCA and distal LM/proximal LCx/OM1 disease (01/2021) Not a surgical candidate.  Unsuccessful revascularization attempt of proximal RCA (01/2021)  Successful atherectomy and PCI to OM (01/2021).  Unable to deliver stent to circumflex with nonflow limiting dissection. Now with worsening LVEF 30-35% w/high risk stress test, Continue Aspirin  81 mg daily. Continue Lipitor  80 mg daily, Ranexa  1000 mg twice daily. Increase carvedilol  back to 6.25 mg twice daily. Hide stress test with multiple areas of ischemia as detailed above.  He has had at least 1 episode of chest pain with emotional stress.  I reckon he is not having exertional angina as his physical activity is limited due to exertional dyspnea.  I recommend checking labs today.  If renal function will allow, he would need left and right heart  catheterization and likely heart team discussion regarding revascularization, should he have recurrent anginal symptoms.     Informed Consent   Shared Decision Making/Informed Consent The risks [stroke (1 in 1000), death (1 in 1000), kidney failure [usually temporary] (1 in 500), bleeding (1 in 200), allergic reaction [possibly serious] (1 in 200)], benefits (diagnostic support and management of coronary artery disease) and alternatives of a cardiac catheterization were discussed in detail with Mr. Miano and he is willing to proceed.      HFrEF: EF 25-30%. Entresto  was held due to increased creatinine.  Continue Lasix , Farxiga , carvedilol .          Meds ordered this encounter  Medications   carvedilol  (COREG ) 6.25 MG tablet    Sig: Take 1 tablet (6.25 mg total) by mouth 2 (two) times daily.    Dispense:  180 tablet    Refill:  3   nitroGLYCERIN  (NITROSTAT ) 0.4 MG SL tablet    Sig: Place 1 tablet (0.4 mg total) under the tongue every 5 (five) minutes x 3 doses as needed for chest pain.    Dispense:  30 tablet    Refill:  1     F/u in 6-8 weeks  Signed, Newman JINNY Lawrence, MD

## 2024-07-28 NOTE — Patient Instructions (Signed)
 Medication Instructions:  INCREASE Carvedilol  to take one tablet twice daily  STOP Potassium Refilled nitroglycerin   *If you need a refill on your cardiac medications before your next appointment, please call your pharmacy*  Lab Work: BMP CBC PROBNP MAGNESIUM  If you have labs (blood work) drawn today and your tests are completely normal, you will receive your results only by: MyChart Message (if you have MyChart) OR A paper copy in the mail If you have any lab test that is abnormal or we need to change your treatment, we will call you to review the results.  Testing/Procedures: Heart cath pending blood work results   Follow-Up: At [facility], you and your health needs are our priority.  As part of our continuing mission to provide you with exceptional heart care, our providers are all part of one team.  This team includes your primary Cardiologist (physician) and Advanced Practice Providers or APPs (Physician Assistants and Nurse Practitioners) who all work together to provide you with the care you need, when you need it.  Your next appointment:   1 month(s)  Provider:   Newman JINNY Lawrence, MD

## 2024-07-28 NOTE — Progress Notes (Signed)
 Multiple abnormalities on stress test results. Lets see if we can see him today since I am in the hospital ll next week.  Thanks MJP

## 2024-07-29 ENCOUNTER — Encounter: Payer: Self-pay | Admitting: Cardiology

## 2024-07-29 ENCOUNTER — Ambulatory Visit: Payer: Self-pay | Admitting: Cardiology

## 2024-07-29 LAB — PRO B NATRIURETIC PEPTIDE: NT-Pro BNP: 4553 pg/mL — AB (ref 0–376)

## 2024-07-29 LAB — CBC
Hematocrit: 56.3 % — ABNORMAL HIGH (ref 37.5–51.0)
Hemoglobin: 18.9 g/dL — ABNORMAL HIGH (ref 13.0–17.7)
MCH: 33.2 pg — ABNORMAL HIGH (ref 26.6–33.0)
MCHC: 33.6 g/dL (ref 31.5–35.7)
MCV: 99 fL — ABNORMAL HIGH (ref 79–97)
Platelets: 213 x10E3/uL (ref 150–450)
RBC: 5.7 x10E6/uL (ref 4.14–5.80)
RDW: 13.9 % (ref 11.6–15.4)
WBC: 14.3 x10E3/uL — ABNORMAL HIGH (ref 3.4–10.8)

## 2024-07-29 LAB — BASIC METABOLIC PANEL WITH GFR
BUN/Creatinine Ratio: 20 (ref 10–24)
BUN: 33 mg/dL — AB (ref 8–27)
CO2: 20 mmol/L (ref 20–29)
Calcium: 9.5 mg/dL (ref 8.6–10.2)
Chloride: 102 mmol/L (ref 96–106)
Creatinine, Ser: 1.67 mg/dL — AB (ref 0.76–1.27)
Glucose: 116 mg/dL — AB (ref 70–99)
Potassium: 4.1 mmol/L (ref 3.5–5.2)
Sodium: 141 mmol/L (ref 134–144)
eGFR: 43 mL/min/1.73 — AB (ref >59)

## 2024-07-29 LAB — MAGNESIUM: Magnesium: 2.3 mg/dL (ref 1.6–2.3)

## 2024-07-29 NOTE — H&P (View-Only) (Signed)
 Creatinine slightly improved, probably his new baseline.  Hold Entresto  until after heart catheterization.  Okay to proceed with left and right heart catheterization for diagnostic purposes only.  Separately, he is hemoglobin is elevated at 18.9.  Please convey this finding to his PCP and consider hematology consult for polycythemia.  Thanks MJP

## 2024-07-29 NOTE — Progress Notes (Signed)
 Creatinine slightly improved, probably his new baseline.  Hold Entresto  until after heart catheterization.  Okay to proceed with left and right heart catheterization for diagnostic purposes only.  Separately, he is hemoglobin is elevated at 18.9.  Please convey this finding to his PCP and consider hematology consult for polycythemia.  Thanks MJP

## 2024-08-09 ENCOUNTER — Telehealth: Payer: Self-pay | Admitting: *Deleted

## 2024-08-09 NOTE — Telephone Encounter (Signed)
 Cardiac Catheterization scheduled at [facility] for: Wednesday August 11, 2024 11:30 AM Arrival time [facility] Main Entrance A at: 9:30 AM  Diet: -Nothing to eat after midnight.  Hydration: -May drink clear liquids until 2 hours before the procedure.  Approved liquids: Water, clear tea, black coffee, fruit juices-non-citric and without pulp,Gatorade, plain Jello/popsicles.   -Please drink 8 oz of water 2 hours before procedure.  Medication instructions: -Hold:  Lasix -day before and day of procedure-per protocol eGFR < 60 (43)  Entresto -on hold (see 07/28/24 BMP results)  Farxiga -AM of procedure  -Other usual morning medications can be taken including aspirin  81 mg.  Plan to go home the same day, you will only stay overnight if medically necessary.  You must have responsible adult to drive you home.  Someone must be with you the first 24 hours after you arrive home.  Reviewed procedure instructions with patient.

## 2024-08-09 NOTE — Telephone Encounter (Signed)
 Per 08/02/24 Staff Message Dr Elmira: I will hold off IV hydration given his low EF.  We can always give IV fluids post cath if necessary depending on LVEDP.  I would recommend regular oral hydration protocol

## 2024-08-11 ENCOUNTER — Encounter (HOSPITAL_COMMUNITY)
Admission: RE | Disposition: A | Discharge status: Home Health | Payer: Self-pay | Source: Home / Self Care | Attending: Internal Medicine

## 2024-08-11 ENCOUNTER — Other Ambulatory Visit: Payer: Self-pay

## 2024-08-11 ENCOUNTER — Inpatient Hospital Stay (HOSPITAL_COMMUNITY)
Admission: RE | Admit: 2024-08-11 | Discharge: 2024-08-13 | DRG: 286 | Disposition: A | Discharge status: Home Health | Attending: Internal Medicine | Admitting: Internal Medicine

## 2024-08-11 DIAGNOSIS — Z9981 Dependence on supplemental oxygen: Secondary | ICD-10-CM

## 2024-08-11 DIAGNOSIS — N179 Acute kidney failure, unspecified: Secondary | ICD-10-CM | POA: Diagnosis present

## 2024-08-11 DIAGNOSIS — J9621 Acute and chronic respiratory failure with hypoxia: Secondary | ICD-10-CM | POA: Diagnosis present

## 2024-08-11 DIAGNOSIS — I252 Old myocardial infarction: Secondary | ICD-10-CM | POA: Diagnosis not present

## 2024-08-11 DIAGNOSIS — U099 Post covid-19 condition, unspecified: Secondary | ICD-10-CM | POA: Diagnosis present

## 2024-08-11 DIAGNOSIS — Z87891 Personal history of nicotine dependence: Secondary | ICD-10-CM | POA: Diagnosis not present

## 2024-08-11 DIAGNOSIS — Z7951 Long term (current) use of inhaled steroids: Secondary | ICD-10-CM

## 2024-08-11 DIAGNOSIS — J439 Emphysema, unspecified: Secondary | ICD-10-CM | POA: Diagnosis present

## 2024-08-11 DIAGNOSIS — I2582 Chronic total occlusion of coronary artery: Secondary | ICD-10-CM | POA: Diagnosis present

## 2024-08-11 DIAGNOSIS — Z85828 Personal history of other malignant neoplasm of skin: Secondary | ICD-10-CM | POA: Diagnosis not present

## 2024-08-11 DIAGNOSIS — I272 Pulmonary hypertension, unspecified: Secondary | ICD-10-CM | POA: Diagnosis present

## 2024-08-11 DIAGNOSIS — Z8249 Family history of ischemic heart disease and other diseases of the circulatory system: Secondary | ICD-10-CM | POA: Diagnosis not present

## 2024-08-11 DIAGNOSIS — I5023 Acute on chronic systolic (congestive) heart failure: Secondary | ICD-10-CM | POA: Diagnosis present

## 2024-08-11 DIAGNOSIS — N1832 Chronic kidney disease, stage 3b: Secondary | ICD-10-CM | POA: Diagnosis present

## 2024-08-11 DIAGNOSIS — J8489 Other specified interstitial pulmonary diseases: Secondary | ICD-10-CM | POA: Diagnosis present

## 2024-08-11 DIAGNOSIS — I130 Hypertensive heart and chronic kidney disease with heart failure and stage 1 through stage 4 chronic kidney disease, or unspecified chronic kidney disease: Secondary | ICD-10-CM | POA: Diagnosis present

## 2024-08-11 DIAGNOSIS — Z79899 Other long term (current) drug therapy: Secondary | ICD-10-CM

## 2024-08-11 DIAGNOSIS — K219 Gastro-esophageal reflux disease without esophagitis: Secondary | ICD-10-CM | POA: Diagnosis present

## 2024-08-11 DIAGNOSIS — T82855A Stenosis of coronary artery stent, initial encounter: Secondary | ICD-10-CM | POA: Diagnosis present

## 2024-08-11 DIAGNOSIS — I509 Heart failure, unspecified: Secondary | ICD-10-CM

## 2024-08-11 DIAGNOSIS — Z880 Allergy status to penicillin: Secondary | ICD-10-CM

## 2024-08-11 DIAGNOSIS — Z7982 Long term (current) use of aspirin: Secondary | ICD-10-CM

## 2024-08-11 DIAGNOSIS — I25119 Atherosclerotic heart disease of native coronary artery with unspecified angina pectoris: Secondary | ICD-10-CM | POA: Diagnosis present

## 2024-08-11 DIAGNOSIS — Y831 Surgical operation with implant of artificial internal device as the cause of abnormal reaction of the patient, or of later complication, without mention of misadventure at the time of the procedure: Secondary | ICD-10-CM | POA: Diagnosis present

## 2024-08-11 DIAGNOSIS — Z882 Allergy status to sulfonamides status: Secondary | ICD-10-CM

## 2024-08-11 DIAGNOSIS — R001 Bradycardia, unspecified: Secondary | ICD-10-CM | POA: Diagnosis present

## 2024-08-11 DIAGNOSIS — I5020 Unspecified systolic (congestive) heart failure: Principal | ICD-10-CM | POA: Diagnosis present

## 2024-08-11 DIAGNOSIS — R0609 Other forms of dyspnea: Secondary | ICD-10-CM

## 2024-08-11 DIAGNOSIS — Z77098 Contact with and (suspected) exposure to other hazardous, chiefly nonmedicinal, chemicals: Secondary | ICD-10-CM | POA: Diagnosis present

## 2024-08-11 DIAGNOSIS — I2510 Atherosclerotic heart disease of native coronary artery without angina pectoris: Secondary | ICD-10-CM

## 2024-08-11 HISTORY — PX: RIGHT/LEFT HEART CATH AND CORONARY ANGIOGRAPHY: CATH118266

## 2024-08-11 LAB — POCT I-STAT EG7
Acid-Base Excess: 0 mmol/L (ref 0.0–2.0)
Acid-base deficit: 1 mmol/L (ref 0.0–2.0)
Bicarbonate: 24.2 mmol/L (ref 20.0–28.0)
Bicarbonate: 25.3 mmol/L (ref 20.0–28.0)
Calcium, Ion: 1.09 mmol/L — ABNORMAL LOW (ref 1.15–1.40)
Calcium, Ion: 1.14 mmol/L — ABNORMAL LOW (ref 1.15–1.40)
HCT: 42 % (ref 39.0–52.0)
HCT: 43 % (ref 39.0–52.0)
Hemoglobin: 14.3 g/dL (ref 13.0–17.0)
Hemoglobin: 14.6 g/dL (ref 13.0–17.0)
O2 Saturation: 61 %
O2 Saturation: 65 %
Potassium: 3.2 mmol/L — ABNORMAL LOW (ref 3.5–5.1)
Potassium: 3.3 mmol/L — ABNORMAL LOW (ref 3.5–5.1)
Sodium: 139 mmol/L (ref 135–145)
Sodium: 140 mmol/L (ref 135–145)
TCO2: 25 mmol/L (ref 22–32)
TCO2: 27 mmol/L (ref 22–32)
pCO2, Ven: 41.8 mmHg — ABNORMAL LOW (ref 44–60)
pCO2, Ven: 42.4 mmHg — ABNORMAL LOW (ref 44–60)
pH, Ven: 7.37 (ref 7.25–7.43)
pH, Ven: 7.383 (ref 7.25–7.43)
pO2, Ven: 32 mmHg (ref 32–45)
pO2, Ven: 35 mmHg (ref 32–45)

## 2024-08-11 LAB — POCT I-STAT 7, (LYTES, BLD GAS, ICA,H+H)
Acid-base deficit: 2 mmol/L (ref 0.0–2.0)
Bicarbonate: 22.2 mmol/L (ref 20.0–28.0)
Calcium, Ion: 1.07 mmol/L — ABNORMAL LOW (ref 1.15–1.40)
HCT: 41 % (ref 39.0–52.0)
Hemoglobin: 13.9 g/dL (ref 13.0–17.0)
O2 Saturation: 95 %
Potassium: 3.1 mmol/L — ABNORMAL LOW (ref 3.5–5.1)
Sodium: 140 mmol/L (ref 135–145)
TCO2: 23 mmol/L (ref 22–32)
pCO2 arterial: 35.3 mmHg (ref 32–48)
pH, Arterial: 7.406 (ref 7.35–7.45)
pO2, Arterial: 76 mmHg — ABNORMAL LOW (ref 83–108)

## 2024-08-11 LAB — BASIC METABOLIC PANEL WITH GFR
Anion gap: 11 (ref 5–15)
BUN: 17 mg/dL (ref 8–23)
CO2: 26 mmol/L (ref 22–32)
Calcium: 8.5 mg/dL — ABNORMAL LOW (ref 8.9–10.3)
Chloride: 102 mmol/L (ref 98–111)
Creatinine, Ser: 1.56 mg/dL — ABNORMAL HIGH (ref 0.61–1.24)
GFR, Estimated: 47 mL/min — ABNORMAL LOW (ref >60)
Glucose, Bld: 142 mg/dL — ABNORMAL HIGH (ref 70–99)
Potassium: 3.5 mmol/L (ref 3.5–5.1)
Sodium: 139 mmol/L (ref 135–145)

## 2024-08-11 LAB — CBC
HCT: 49.2 % (ref 39.0–52.0)
Hemoglobin: 17.1 g/dL — ABNORMAL HIGH (ref 13.0–17.0)
MCH: 33.1 pg (ref 26.0–34.0)
MCHC: 34.8 g/dL (ref 30.0–36.0)
MCV: 95.2 fL (ref 80.0–100.0)
Platelets: 173 K/uL (ref 150–400)
RBC: 5.17 MIL/uL (ref 4.22–5.81)
RDW: 13.8 % (ref 11.5–15.5)
WBC: 6.6 K/uL (ref 4.0–10.5)
nRBC: 0 % (ref 0.0–0.2)

## 2024-08-11 LAB — BRAIN NATRIURETIC PEPTIDE: B Natriuretic Peptide: 616.5 pg/mL — ABNORMAL HIGH (ref 0.0–100.0)

## 2024-08-11 LAB — CG4 I-STAT (LACTIC ACID): Lactic Acid, Venous: 0.6 mmol/L (ref 0.5–1.9)

## 2024-08-11 SURGERY — RIGHT/LEFT HEART CATH AND CORONARY ANGIOGRAPHY
Anesthesia: LOCAL

## 2024-08-11 MED ORDER — RANOLAZINE ER 500 MG PO TB12
1000.0000 mg | ORAL_TABLET | Freq: Two times a day (BID) | ORAL | Status: DC
  Administered 2024-08-11 – 2024-08-13 (×4): 1000 mg via ORAL
  Filled 2024-08-11 (×6): qty 2

## 2024-08-11 MED ORDER — SODIUM CHLORIDE 0.9 % IV SOLN
INTRAVENOUS | Status: DC
Start: 2024-08-11 — End: 2024-08-11

## 2024-08-11 MED ORDER — SODIUM CHLORIDE 0.9% FLUSH
3.0000 mL | Freq: Two times a day (BID) | INTRAVENOUS | Status: DC
  Administered 2024-08-11 – 2024-08-13 (×2): 3 mL via INTRAVENOUS

## 2024-08-11 MED ORDER — MIDAZOLAM HCL 2 MG/2ML IJ SOLN
INTRAMUSCULAR | Status: DC | PRN
  Administered 2024-08-11: 1 mg via INTRAVENOUS

## 2024-08-11 MED ORDER — ATORVASTATIN CALCIUM 80 MG PO TABS
80.0000 mg | ORAL_TABLET | Freq: Every day | ORAL | Status: DC
  Administered 2024-08-11 – 2024-08-13 (×3): 80 mg via ORAL
  Filled 2024-08-11 (×3): qty 1

## 2024-08-11 MED ORDER — IOHEXOL 350 MG/ML SOLN
INTRAVENOUS | Status: DC | PRN
  Administered 2024-08-11: 60 mL

## 2024-08-11 MED ORDER — HEPARIN SODIUM (PORCINE) 1000 UNIT/ML IJ SOLN
INTRAMUSCULAR | Status: AC
  Filled 2024-08-11: qty 10

## 2024-08-11 MED ORDER — HEPARIN (PORCINE) IN NACL 1000-0.9 UT/500ML-% IV SOLN
INTRAVENOUS | Status: DC | PRN
  Administered 2024-08-11 (×2): 500 mL

## 2024-08-11 MED ORDER — VERAPAMIL HCL 2.5 MG/ML IV SOLN
INTRAVENOUS | Status: DC | PRN
  Administered 2024-08-11: 10 mL via INTRA_ARTERIAL

## 2024-08-11 MED ORDER — ONDANSETRON HCL 4 MG/2ML IJ SOLN: 4.0000 mg | Freq: Four times a day (QID) | INTRAMUSCULAR | Status: DC | PRN

## 2024-08-11 MED ORDER — PANTOPRAZOLE SODIUM 40 MG PO TBEC
40.0000 mg | DELAYED_RELEASE_TABLET | Freq: Every day | ORAL | Status: DC
  Administered 2024-08-12 – 2024-08-13 (×2): 40 mg via ORAL
  Filled 2024-08-11 (×2): qty 1

## 2024-08-11 MED ORDER — FUROSEMIDE 10 MG/ML IJ SOLN
80.0000 mg | Freq: Two times a day (BID) | INTRAMUSCULAR | Status: DC
Start: 2024-08-11 — End: 2024-08-11
  Administered 2024-08-11: 80 mg via INTRAVENOUS

## 2024-08-11 MED ORDER — VITAMIN B-12 1000 MCG PO TABS
500.0000 ug | ORAL_TABLET | Freq: Every day | ORAL | Status: DC
  Administered 2024-08-12 – 2024-08-13 (×2): 500 ug via ORAL
  Filled 2024-08-11 (×2): qty 1

## 2024-08-11 MED ORDER — MIDAZOLAM HCL 2 MG/2ML IJ SOLN
INTRAMUSCULAR | Status: AC
  Filled 2024-08-11: qty 2

## 2024-08-11 MED ORDER — FENTANYL CITRATE (PF) 100 MCG/2ML IJ SOLN
INTRAMUSCULAR | Status: AC
  Filled 2024-08-11: qty 2

## 2024-08-11 MED ORDER — FENTANYL CITRATE (PF) 100 MCG/2ML IJ SOLN
INTRAMUSCULAR | Status: DC | PRN
  Administered 2024-08-11: 25 ug via INTRAVENOUS

## 2024-08-11 MED ORDER — MILRINONE LACTATE IN DEXTROSE 20-5 MG/100ML-% IV SOLN
0.1250 ug/kg/min | INTRAVENOUS | Status: DC
  Administered 2024-08-11: 0.125 ug/kg/min via INTRAVENOUS
  Filled 2024-08-11 (×2): qty 100

## 2024-08-11 MED ORDER — ASPIRIN 81 MG PO TBEC
81.0000 mg | DELAYED_RELEASE_TABLET | Freq: Every day | ORAL | Status: DC
  Administered 2024-08-12 – 2024-08-13 (×2): 81 mg via ORAL
  Filled 2024-08-11 (×2): qty 1

## 2024-08-11 MED ORDER — FREE WATER
250.0000 mL | Freq: Once | Status: DC
Start: 2024-08-11 — End: 2024-08-11

## 2024-08-11 MED ORDER — CHLORHEXIDINE GLUCONATE CLOTH 2 % EX PADS: 6.0000 | MEDICATED_PAD | Freq: Every day | CUTANEOUS | Status: DC

## 2024-08-11 MED ORDER — HYDRALAZINE HCL 20 MG/ML IJ SOLN: 10.0000 mg | INTRAMUSCULAR | Status: AC | PRN

## 2024-08-11 MED ORDER — LIDOCAINE HCL (PF) 1 % IJ SOLN
INTRAMUSCULAR | Status: DC | PRN
  Administered 2024-08-11 (×2): 2 mL

## 2024-08-11 MED ORDER — ACETAMINOPHEN 325 MG PO TABS: 650.0000 mg | ORAL_TABLET | ORAL | Status: DC | PRN

## 2024-08-11 MED ORDER — SODIUM CHLORIDE 0.9 % IV SOLN: 250.0000 mL | INTRAVENOUS | Status: AC | PRN

## 2024-08-11 MED ORDER — SODIUM CHLORIDE 0.9% FLUSH: 3.0000 mL | INTRAVENOUS | Status: DC | PRN

## 2024-08-11 MED ORDER — SODIUM CHLORIDE 0.9% FLUSH: 10.0000 mL | INTRAVENOUS | Status: DC | PRN

## 2024-08-11 MED ORDER — FREE WATER
250.0000 mL | Freq: Once | Status: AC
  Administered 2024-08-11: 250 mL via ORAL

## 2024-08-11 MED ORDER — BUDESON-GLYCOPYRROL-FORMOTEROL 160-9-4.8 MCG/ACT IN AERO
1.0000 | INHALATION_SPRAY | Freq: Every day | RESPIRATORY_TRACT | Status: DC
  Administered 2024-08-12 – 2024-08-13 (×2): 1 via RESPIRATORY_TRACT
  Filled 2024-08-11: qty 5.9

## 2024-08-11 MED ORDER — DAPAGLIFLOZIN PROPANEDIOL 10 MG PO TABS
10.0000 mg | ORAL_TABLET | Freq: Every day | ORAL | Status: DC
  Administered 2024-08-12 – 2024-08-13 (×2): 10 mg via ORAL
  Filled 2024-08-11 (×2): qty 1

## 2024-08-11 MED ORDER — LABETALOL HCL 5 MG/ML IV SOLN: 10.0000 mg | INTRAVENOUS | Status: DC | PRN

## 2024-08-11 MED ORDER — HEPARIN SODIUM (PORCINE) 1000 UNIT/ML IJ SOLN
INTRAMUSCULAR | Status: DC | PRN
  Administered 2024-08-11: 6000 [IU] via INTRAVENOUS

## 2024-08-11 MED ORDER — SODIUM CHLORIDE 0.9% FLUSH
10.0000 mL | Freq: Two times a day (BID) | INTRAVENOUS | Status: DC
  Administered 2024-08-12 – 2024-08-13 (×4): 10 mL

## 2024-08-11 MED ORDER — SODIUM CHLORIDE 0.9% FLUSH
3.0000 mL | Freq: Two times a day (BID) | INTRAVENOUS | Status: DC
  Administered 2024-08-11 – 2024-08-13 (×3): 3 mL via INTRAVENOUS

## 2024-08-11 MED ORDER — HYDROCODONE-ACETAMINOPHEN 10-325 MG PO TABS: 1.0000 | ORAL_TABLET | ORAL | Status: DC | PRN

## 2024-08-11 MED ORDER — LIDOCAINE HCL (PF) 1 % IJ SOLN
INTRAMUSCULAR | Status: AC
  Filled 2024-08-11: qty 30

## 2024-08-11 MED ORDER — SODIUM CHLORIDE 0.9% FLUSH
3.0000 mL | Freq: Two times a day (BID) | INTRAVENOUS | Status: DC
Start: 2024-08-11 — End: 2024-08-11

## 2024-08-11 MED ORDER — SODIUM CHLORIDE 0.9 % IV SOLN: 250.0000 mL | INTRAVENOUS | Status: DC | PRN

## 2024-08-11 MED ORDER — HEPARIN SODIUM (PORCINE) 5000 UNIT/ML IJ SOLN
5000.0000 [IU] | Freq: Three times a day (TID) | INTRAMUSCULAR | Status: DC
  Administered 2024-08-11 – 2024-08-13 (×5): 5000 [IU] via SUBCUTANEOUS
  Filled 2024-08-11 (×5): qty 1

## 2024-08-11 MED ORDER — ASPIRIN 81 MG PO CHEW: 81.0000 mg | CHEWABLE_TABLET | ORAL | Status: DC

## 2024-08-11 MED ORDER — VERAPAMIL HCL 2.5 MG/ML IV SOLN
INTRAVENOUS | Status: AC
  Filled 2024-08-11: qty 2

## 2024-08-11 MED ORDER — FUROSEMIDE 10 MG/ML IJ SOLN
INTRAMUSCULAR | Status: AC
  Filled 2024-08-11: qty 8

## 2024-08-11 SURGICAL SUPPLY — 12 items
CATH 5FR JL3.5 JR4 ANG PIG MP (CATHETERS) IMPLANT
CATH BALLN WEDGE 5F 110CM (CATHETERS) IMPLANT
CATH INFINITI 5FR JL4 (CATHETERS) IMPLANT
CATH LAUNCHER 6FR EBU 3.75 (CATHETERS) IMPLANT
DEVICE RAD COMP TR BAND LRG (VASCULAR PRODUCTS) IMPLANT
GLIDESHEATH SLEND A-KIT 6F 22G (SHEATH) IMPLANT
GLIDESHEATH SLEND SS 6F .021 (SHEATH) IMPLANT
GUIDEWIRE INQWIRE 1.5J.035X260 (WIRE) IMPLANT
PACK CARDIAC CATHETERIZATION (CUSTOM PROCEDURE TRAY) ×1 IMPLANT
SET ATX-X65L (MISCELLANEOUS) IMPLANT
SHEATH GLIDE SLENDER 4/5FR (SHEATH) IMPLANT
WIRE MICROINTRODUCER 60CM (WIRE) IMPLANT

## 2024-08-11 NOTE — Progress Notes (Addendum)
 Peripherally Inserted Central Catheter Placement  The IV Nurse has discussed with the patient and/or persons authorized to consent for the patient, the purpose of this procedure and the potential benefits and risks involved with this procedure.  The benefits include less needle sticks, lab draws from the catheter, and the patient may be discharged home with the catheter. Risks include, but not limited to, infection, bleeding, blood clot (thrombus formation), and puncture of an artery; nerve damage and irregular heartbeat and possibility to perform a PICC exchange if needed/ordered by physician.  Alternatives to this procedure were also discussed.  Bard Power PICC patient education guide, fact sheet on infection prevention and patient information card has been provided to patient /or left at bedside.    PICC Placement Documentation  PICC Double Lumen 08/11/24 Right Basilic 46 cm 0 cm (Active)  Indication for Insertion or Continuance of Line Vasoactive infusions 08/11/24 2310  Exposed Catheter (cm) 0 cm 08/11/24 2310  Site Assessment Clean, Dry, Intact 08/11/24 2310  Lumen #1 Status Flushed;Saline locked;Blood return noted 08/11/24 2310  Lumen #2 Status Flushed;Saline locked;Blood return noted 08/11/24 2310  Dressing Type Transparent;Securing device 08/11/24 2310  Dressing Status Antimicrobial disc/dressing in place;Clean, Dry, Intact 08/11/24 2310  Line Care Connections checked and tightened 08/11/24 2310  Dressing Intervention New dressing;Adhesive placed at insertion site (IV team only) 08/11/24 2310  Dressing Change Due 08/18/24 08/11/24 2310    Telephone consent obtained by patient's spouse.    Hobert Benders 08/11/2024, 11:24 PM

## 2024-08-11 NOTE — H&P (Cosign Needed Addendum)
 Cardiology Admission History and Physical   Patient ID: Darrell Simpson MRN: [id number]; DOB: [birth year]   Admission date: 08/11/2024  PCP:  Norval Kettle, MD   [facility] Providers Cardiologist:  Newman JINNY Lawrence, MD     Chief Complaint:  pulmonary HTN/CHF/CAD  Patient Profile: Darrell Simpson is a 73 y.o. 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  History of Present Illness: Darrell Simpson is a 73 year old male with past medical history noted above.  He has been followed by Dr. Lawrence as an outpatient.  Initially seen back in 2022 and underwent heart catheterization found to have multivessel CAD including occluded proximal LAD.  He was evaluated by CT surgery but deemed not to be an appropriate surgical candidate secondary to his lung disease.  He had unsuccessful attempt at PCI to RCA but did undergo successful orbital atherectomy PTCA and stenting to OM1.  Attempts were made to intervene on the circumflex but unable to advance stent which led to nonflow limiting dissection in the proximal circumflex.  Had bradycardia while on Coreg  therefore this was stopped.  He had complained of worsening exertional dyspnea and leg edema which improved after increasing his Lasix  dose.  Followed by pulmonology, Dr. 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  He is maintained on home O2 at 2 L, as well as CPAP.  Underwent outpatient cardiac PET 07/2024 with severe partially reversible perfusion defect in the apical to mid anterior/anterior septal wall consistent with infarct, peri-infarct ischemia.  Study overall suggestive of severe multivessel CAD and considered high risk with recommendations for cardiac catheterization.   Echocardiogram at that time showed LVEF of 30 to 35%, apical hypokinesis, mild LVH, grade 1 diastolic dysfunction, mildly reduced RV with no significant valvular disease.  He was seen in the office by Dr. Lawrence on 9/17 and reported that his lower extremity edema had gotten better but continued to have exertional dyspnea as well as chest pain with emotional stress.  Given his recent cardiac studies as well as symptoms it is recommended that he undergo outpatient cardiac catheterization.  Underwent right and left heart cath 10/1 with significant multivessel disease including 50% distal left main, diffuse 50 to 80% calcified proximal LAD followed by complete occlusion with faint collaterals filling septal vessels from circumflex, ostial/proximal mildly calcific 75% stenosis at bifurcation with large OM 1 vessel with patent stent and 60% ostial in-stent restenosis, 90% distal circumflex stenosis, proximal RCA 90% stenosis.   Right heart catheterization 08/11/2024: RA: 7 mmHg RV: 81/2 mmHg PA: 88/38 mmHg, mPAP 50 mmHg PCW: 17 mmHg   AO sats: 95% PA sats: 63%   CO: 3.9 L/min CI: 1.6 L/min/m2  Recommendations for admission with evaluation by advanced heart failure for further optimization, and discussion at heart team meeting on Friday.  Past Medical History:  Diagnosis Date   Arthritis    psioriatic arthritis    Cancer (HCC)    squamous cell skin cancer right cheek    CHF (congestive heart failure) (HCC)    Chronic kidney disease    back to 80% func- improving per pt    Diverticulosis    GERD (gastroesophageal reflux disease)    Hypertension    Past Surgical History:  Procedure  Laterality Date   COLONOSCOPY     CORONARY ATHERECTOMY N/A 01/30/2021   Procedure: CORONARY ATHERECTOMY;  Surgeon: Elmira Newman PARAS, MD;  Location: [facility];  Service: Cardiovascular;  Laterality: N/A;   CORONARY BALLOON ANGIOPLASTY N/A 01/15/2021   Procedure: CORONARY BALLOON ANGIOPLASTY;   Surgeon: Elmira Newman PARAS, MD;  Location: [facility];  Service: Cardiovascular;  Laterality: N/A;   CORONARY STENT INTERVENTION N/A 01/30/2021   Procedure: CORONARY STENT INTERVENTION;  Surgeon: Elmira Newman PARAS, MD;  Location: [facility];  Service: Cardiovascular;  Laterality: N/A;   CORONARY ULTRASOUND/IVUS N/A 01/30/2021   Procedure: Intravascular Ultrasound/IVUS;  Surgeon: Elmira Newman PARAS, MD;  Location: [facility];  Service: Cardiovascular;  Laterality: N/A;   EYE SURGERY     age 24    LEFT HEART CATH N/A 01/15/2021   Procedure: Left Heart Cath;  Surgeon: Elmira Newman PARAS, MD;  Location: [facility];  Service: Cardiovascular;  Laterality: N/A;   LEFT HEART CATH AND CORONARY ANGIOGRAPHY N/A 01/12/2021   Procedure: LEFT HEART CATH AND CORONARY ANGIOGRAPHY;  Surgeon: Elmira Newman PARAS, MD;  Location: [facility];  Service: Cardiovascular;  Laterality: N/A;   POLYPECTOMY       Medications Prior to Admission: Prior to Admission medications   Medication Sig Start Date End Date Taking? Authorizing Provider  albuterol  (VENTOLIN  HFA) 108 (90 Base) MCG/ACT inhaler Inhale 2 puffs into the lungs every 4 (four) hours as needed for wheezing or shortness of breath.   Yes [provider]  aspirin  81 MG EC tablet Take 1 tablet (81 mg total) by mouth daily. Swallow whole. 06/04/21  Yes Patwardhan, Manish J, MD  atorvastatin  (LIPITOR ) 80 MG tablet TAKE 1 TABLET BY MOUTH ONCE DAILY 06/15/24  Yes Patwardhan, Manish J, MD  budesonide-glycopyrrolate-formoterol  (BREZTRI AEROSPHERE) 160-9-4.8 MCG/ACT AERO inhaler Inhale 1 puff into the lungs daily. 07/16/24  Yes [provider]  carvedilol  (COREG ) 6.25 MG tablet Take 1 tablet (6.25 mg total) by mouth 2 (two) times daily. 07/28/24  Yes Patwardhan, Manish J, MD  Cyanocobalamin (VITAMIN B 12) 500 MCG TABS Take by mouth as needed. Pt takes 1-2 x a month Patient taking differently: Take 500 mcg by mouth  daily.   Yes [provider]  dapagliflozin  propanediol (FARXIGA ) 10 MG TABS tablet Take 1 tablet (10 mg total) by mouth daily before breakfast. 06/16/24  Yes Patwardhan, Manish J, MD  furosemide  (LASIX ) 40 MG tablet Take 1 tablet (40 mg total) by mouth daily. Patient taking differently: Take 80 mg by mouth daily. 06/14/24  Yes Patwardhan, Newman PARAS, MD  HYDROcodone -acetaminophen  (NORCO) 10-325 MG tablet Take 1 tablet by mouth every 4 (four) hours as needed for moderate pain. 06/14/20  Yes [provider]  nitroGLYCERIN  (NITROSTAT ) 0.4 MG SL tablet Place 1 tablet (0.4 mg total) under the tongue every 5 (five) minutes x 3 doses as needed for chest pain. 07/28/24  Yes Patwardhan, Manish J, MD  OXYGEN  Inhale 2 L into the lungs continuous.   Yes [provider]  ranolazine  (RANEXA ) 1000 MG SR tablet TAKE 1 TABLET BY MOUTH TWICE DAILY 06/15/24  Yes Patwardhan, Newman PARAS, MD  SYMBICORT 160-4.5 MCG/ACT inhaler Inhale 2 puffs into the lungs. Patient taking differently: Inhale 2 puffs into the lungs daily. 02/03/23  Yes [provider]  Vitamin D, Ergocalciferol, (DRISDOL) 1.25 MG (50000 UNIT) CAPS capsule Take 50,000 Units by mouth once a week. 06/14/20  Yes [provider]  ipratropium-albuterol  (DUONEB) 0.5-2.5 (3) MG/3ML SOLN Take 3 mLs by nebulization 4 (four) times daily. Patient taking differently: Take 3 mLs by nebulization every 4 (four) hours as needed (emphysema). 01/16/21   Patwardhan, Newman PARAS, MD  pantoprazole  (PROTONIX ) 40 MG tablet TAKE ONE TABLET BY MOUTH DAILY Patient taking differently: Take 40 mg by mouth daily as needed (Heartburn). 07/31/23   Ladona Heinz, MD  sacubitril -valsartan  (ENTRESTO ) 24-26 MG TAKE 1 TABLET BY MOUTH 2 TIMES DAILY Patient not taking: Reported on 08/09/2024 04/21/24   Haze Bethann RAMAN, MD     Allergies:    Allergies  Allergen Reactions   Penicillins Rash   Sulfa Antibiotics Rash    Social History:   Social History    Socioeconomic History   Marital status: Married    Spouse name: Not on file   Number of children: 5   Years of education: Not on file   Highest education level: Not on file  Occupational History   Not on file  Tobacco Use   Smoking status: Former    Current packs/day: 0.00    Average packs/day: 1.5 packs/day for 45.0 years (67.5 ttl pk-yrs)    Types: Cigarettes    Start date: 57    Quit date: 2016    Years since quitting: 9.7   Smokeless tobacco: Former  Advertising account planner   Vaping status: Former   Start date: 02/08/2015   Quit date: 11/30/2020  Substance and Sexual Activity   Alcohol use: Not Currently    Alcohol/week: 12.0 standard drinks of alcohol    Types: 12 Cans of beer per week    Comment: Quit in 2012   Drug use: Never   Sexual activity: Not on file  Other Topics Concern   Not on file  Social History Narrative   Not on file   Social Drivers of Health   Financial Resource Strain: Not on file  Food Insecurity: Low Risk  (03/31/2024)   Received from [facility]   Hunger Vital Sign    Within the past 12 months, you worried that your food would run out before you got money to buy more: Never true    Within the past 12 months, the food you bought just didn't last and you didn't have money to get more. : Never true  Transportation Needs: No Transportation Needs (03/31/2024)   Received from [facility]    In the past 12 months, has lack of reliable transportation kept you from medical appointments, meetings, work or from getting things needed for daily living? : No  Physical Activity: Not on file  Stress: Not on file  Social Connections: Not on file  Intimate Partner Violence: Not on file     Family History:   The patient's family history includes CAD in his father; Congestive Heart Failure in his father; Heart disease in his brother. There is no history of Colon cancer, Colon polyps, Esophageal cancer, Rectal cancer, or Stomach cancer.    ROS:   Please see the history of present illness.  All other ROS reviewed and negative.     Physical Exam/Data: Vitals:   08/11/24 1301 08/11/24 1306 08/11/24 1311 08/11/24 1322  BP: (!) 145/86 (!) 156/86 (!) 156/86 (!) 152/90  Pulse: 65 66 66 64  Resp: 19 (!) 25  17  Temp:    98.2 F (36.8 C)  TempSrc:    Temporal  SpO2: 95% 94%  94%  Weight:      Height:  No intake or output data in the 24 hours ending 08/11/24 1427    08/11/2024   10:06 AM 07/28/2024    4:10 PM 06/14/2024   11:18 AM  Last 3 Weights  Weight (lbs) 268 lb 269 lb 3.2 oz 259 lb 8 oz  Weight (kg) 121.564 kg 122.108 kg 117.708 kg     Body mass index is 36.35 kg/m.  General:  Pleasant older male, sitting up in bed, mildly dyspneic at rest on [place]@2L  HEENT: normal Neck: + JVD to ear Vascular: No carotid bruits; Distal pulses 2+ bilaterally   Cardiac:  normal S1, S2; RRR; no murmur  Lungs: diminished in bases with expiratory wheezing  Abd: soft, nontender, no hepatomegaly  Ext: no edema Musculoskeletal:  No deformities, BUE and BLE strength normal and equal Skin: warm and dry  Neuro: no focal abnormalities noted Psych:  Normal affect   EKG:  Not preformed today   Relevant CV Studies:  Echo: 07/21/2024  IMPRESSIONS     1. Apical hypokinesis. Left ventricular ejection fraction, by estimation,  is 30 to 35%. The left ventricle has moderately decreased function. The  left ventricle demonstrates global hypokinesis. There is mild left  ventricular hypertrophy. Left  ventricular diastolic parameters are consistent with Grade I diastolic  dysfunction (impaired relaxation).   2. Right ventricular systolic function is mildly reduced. The right  ventricular size is normal.   3. The mitral valve is normal in structure. No evidence of mitral valve  regurgitation. No evidence of mitral stenosis.   4. The aortic valve is normal in structure. Aortic valve regurgitation is  mild. No aortic stenosis is present.   5.  Aortic dilatation noted. There is mild dilatation of the ascending  aorta, measuring 42 mm.   6. The inferior vena cava is normal in size with greater than 50%  respiratory variability, suggesting right atrial pressure of 3 mmHg.   FINDINGS   Left Ventricle: Apical hypokinesis. Left ventricular ejection fraction,  by estimation, is 30 to 35%. The left ventricle has moderately decreased  function. The left ventricle demonstrates global hypokinesis. Definity   contrast agent was given IV to  delineate the left ventricular endocardial borders. The left ventricular  internal cavity size was normal in size. There is mild left ventricular  hypertrophy. Left ventricular diastolic parameters are consistent with  Grade I diastolic dysfunction  (impaired relaxation).   Right Ventricle: The right ventricular size is normal. No increase in  right ventricular wall thickness. Right ventricular systolic function is  mildly reduced.   Left [facility]: Left atrial size was normal in size.   Right [facility]: Right atrial size was normal in size.   Pericardium: There is no evidence of pericardial effusion.   Mitral Valve: The mitral valve is normal in structure. Mild mitral annular  calcification. No evidence of mitral valve regurgitation. No evidence of  mitral valve stenosis.   Tricuspid Valve: The tricuspid valve is normal in structure. Tricuspid  valve regurgitation is not demonstrated. No evidence of tricuspid  stenosis.   Aortic Valve: The aortic valve is normal in structure. Aortic valve  regurgitation is mild. No aortic stenosis is present.   Pulmonic Valve: The pulmonic valve was normal in structure. Pulmonic valve  regurgitation is not visualized. No evidence of pulmonic stenosis.   Aorta: Aortic dilatation noted. There is mild dilatation of the ascending  aorta, measuring 42 mm.   Venous: The inferior vena cava is normal in size with greater than 50%  respiratory variability,  suggesting  right atrial pressure of 3 mmHg.   IAS/Shunts: No atrial level shunt detected by color flow Doppler.   Coronary angiography 08/11/2024: LM: Moderately calcified distal 50% stenosis LAD: Proximal diffuse 50 to 60% severe calcific disease, followed by complete occlusion.          Faint collaterals filling septal vessels from the circumflex.  No clear target noted in LAD for either surgical or percutaneous revascularization. Lcx: Large, codominant vessel.         Ostial/proximal mildly calcific 75% stenosis at bifurcation with large OM1 vessel with patent stent and 60% ostial in-stent restenosis         Distal circumflex focal 90% stenosis           RCA: Codominant vessel.  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   Recommendation: Complex intubation in a patient with several medical comorbidities, including CKD stage IIIb, possible interstitial lung disease, HFrEF, multivessel CAD. Primary complaint has been exertional dyspnea, leg edema, as well as occasional chest discomfort, physical activity is limited due to dyspnea. Of note, I was unable to deliver balloons across proximal RCA, and was unable to deliver stent across proximal left circumflex-presumably due to dissection -back in 2022.  Patient did fairly well for last 3 years, until recent dyspnea symptoms and new drop in LVEF. He needs optimization from cardiac as well as pulmonary standpoint before consideration for revascularization. Will admit for IV diuresis and expediting heart failure management. Will discuss in multidisciplinary heart team  meeting regarding revascularization options.   Newman JINNY Lawrence, MD  Laboratory Data: High Sensitivity Troponin:  No results for input(s): TROPONINIHS in the last 720 hours.    ChemistryNo results for input(s): NA, K, CL, CO2, GLUCOSE, BUN, CREATININE, CALCIUM , MG, GFRNONAA, GFRAA, ANIONGAP in the last 168 hours.  No results for input(s): PROT, ALBUMIN, AST, ALT, ALKPHOS, BILITOT in the last 168 hours. Lipids No results for input(s): CHOL, TRIG, HDL, LABVLDL, LDLCALC, CHOLHDL in the last 168 hours. HematologyNo results for input(s): WBC, RBC, HGB, HCT, MCV, MCH, MCHC, RDW, PLT in the last 168 hours. Thyroid No results for input(s): TSH, FREET4 in the last 168 hours. BNPNo results for input(s): BNP, PROBNP in the last 168 hours.  DDimer No results for input(s): DDIMER in the last 168 hours.  Radiology/Studies:  CARDIAC CATHETERIZATION Result Date: 08/11/2024 Images from the original result were not included.   Dist LM to Ost LAD lesion is 50% stenosed.   Prox LAD to Mid LAD lesion is 100% stenosed.   Mid Cx lesion is 40% stenosed.   Dist Cx lesion is 90% stenosed.   Ost Cx to Prox Cx lesion is 75% stenosed.   Mid RCA lesion is 40% stenosed.   Prox RCA lesion is 90% stenosed.   1st Mrg-2 lesion is 60% stenosed.   Non-stenotic 1st Mrg-1 lesion was previously treated.   Non-stenotic 1st Mrg-3 lesion was previously treated. Coronary angiography 08/11/2024: LM: Moderately calcified distal 50% stenosis LAD: Proximal diffuse 50 to 60% severe calcific disease, followed by complete occlusion.  Faint collaterals filling septal vessels from the circumflex.  No clear target noted in LAD for either surgical or percutaneous revascularization. Lcx: Large, codominant vessel.         Ostial/proximal mildly calcific 75% stenosis at bifurcation with large OM1 vessel with patent stent and 60% ostial in-stent restenosis         Distal  circumflex focal 90% stenosis          RCA: Codominant vessel.  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 Recommendation: Complex intubation in a patient with several medical comorbidities, including CKD stage IIIb, possible interstitial lung disease, HFrEF, multivessel CAD. Primary complaint has been exertional dyspnea, leg edema, as well as occasional chest discomfort, physical activity is limited due to dyspnea. Of note, I was unable to deliver balloons across proximal RCA, and was unable to deliver stent across proximal left circumflex-presumably due to dissection -back in 2022.  Patient did fairly well for last 3 years, until recent dyspnea symptoms and new drop in LVEF. He needs optimization from cardiac as well as pulmonary standpoint before consideration for revascularization. Will admit for IV diuresis and expediting heart failure management. Will discuss in multidisciplinary heart team meeting regarding revascularization options. Newman JINNY Lawrence, MD     Assessment and Plan:  Darrell Simpson is a 73 y.o. 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  Acute HFrEF ICM Pulmonary HTN -- Echo 07/21/2024 LVEF of 30 to 35%, apical hypokinesis, mild LVH, grade 1 diastolic dysfunction, mildly reduced RV with no significant valvular disease. -- RHC: RA: 7 mmHg RV: 81/2 mmHg PA: 88/38 mmHg, mPAP 50 mmHg PCW: 17 mmHg, CO 3.9 CI 1.6. He is warm on exam with stable BP  -- GDMT: previously on coreg  (stopped in the setting of  bradycardia), Entresto  (stopped with rise in Cr) -- start IV lasix  80mg  BID, no BB with low CI -- continue farxiga    Multivessel CAD s/p prior PCI to [id number]'77 -- as noted above, cath with multivessel CAD. Of note, previously deemed not a surgical candidate back in 2022.  -- no chest pain currently  -- plans to discuss at multidisciplinary meeting on Friday -- continue ASA, statin   ILD/emphysema on home O2 -- follows with pulmonary at [facility] -- CT chest report 04/2024 with diffuse reticulation in both lungs worse in bases with traction bronchiectasis and honeycombing, mild emphysema  -- 05/11/2024 PFT> Ratio 64% FEV1 1.99L (72%), FVC 3.11 (86%), TLC 4.53 L (72%) DLCO 7.92 (35%)  -- continue Breztri  CKD IIIa -- baseline Cr around 1.4-1.6 -- follow BMET  Risk Assessment/Risk Scores:  [place]  Heart Association (NYHA) Functional Class NYHA Class III/ mixed symptoms with pulmonary HTN   Code Status: Full Code  Severity of Illness: The appropriate patient status for this patient is INPATIENT. Inpatient status is judged to be reasonable and necessary in order to provide the required intensity of service to ensure the patient's safety. The patient's presenting symptoms, physical exam findings, and initial radiographic and laboratory data in the context  of their chronic comorbidities is felt to place them at high risk for further clinical deterioration. Furthermore, it is not anticipated that the patient will be medically stable for discharge from the hospital within 2 midnights of admission.   * I certify that at the point of admission it is my clinical judgment that the patient will require inpatient hospital care spanning beyond 2 midnights from the point of admission due to high intensity of service, high risk for further deterioration and high frequency of surveillance required.*  For questions or updates, please contact [facility] Please consult [email or web address] for contact  info under   Signed, Manuelita Rummer, NP  08/11/2024 2:27 PM   Patient seen and examined with the above-signed Advanced Practice Provider and/or Housestaff. I personally reviewed laboratory data, imaging studies and relevant notes. I independently examined the patient and formulated the important aspects of the plan. I have edited the note to reflect any of my changes or salient points. I have personally discussed the plan with the patient and/or family.  Very complicated patient with obesity, ILD, COPD with active tobacco use, mvCAD, OSA and systolic HF due to iCM  Recently seen by Dr, Alaine in Pulmonary for ILD. CT scan stable. Felt to have combination of COPD and mild ILD as well as systolic HF. Was still smoking.   Admitted for persistent dyspnea.   Cath today with severe mvCAD with high grade ostial RCA disease, CTO LAD and probably high-grade LM disease. RHC with severe PAH. EF 30-35% on echo   Reports dyspnea with almost any activity. + LE edema. Very frustrated  RA: 7 mmHg RV: 81/2 mmHg PA: 88/38 mmHg, mPAP 50 mmHg PCW: 17 mmHg   AO sats: 95% PA sats: 63%   CO: 3.9 L/min CI: 1.6 L/min/m2  PVR 8.5 WU PAPi 7.2  05/11/2024 PFT> Ratio 64% FEV1 1.99L (72%), FVC 3.11 (86%), TLC 4.53 L (72%) DLCO 7.92 (35%)   General:  Lying in bed wearing bipap HEENT: normal Neck: supple.JVP 10. . Cor: PMI nondisplaced. Regular rate & rhythm. 2/6 TR Lungs: + fine crackles Abdomen: soft, nontender, nondistended. No hepatosplenomegaly. No bruits or masses. Good bowel sounds. Extremities: no cyanosis, clubbing, rash,  2+ edema Neuro: alert & orientedx3, cranial nerves grossly intact. moves all 4 extremities w/o difficulty. Affect pleasant  His dyspnea is clearly multifactorial but suspect his pulmonary HTN is a major player given his severely reduced DLCO  In reviewing his cath he has severe mvCAD but will not be candidate for CABG with his PAH/ILD. Will review options with IC team.   For  now will diurese as tolerated, repeat hi-res CT. Once fully diuresed can begin empiric trial of PDE-5 to address PH.   Toribio Fuel, MD  12:24 AM

## 2024-08-11 NOTE — Interval H&P Note (Signed)
 History and Physical Interval Note:  08/11/2024 12:14 PM  Darrell Simpson  has presented today for surgery, with the diagnosis of abnormal stress.  The various methods of treatment have been discussed with the patient and family. After consideration of risks, benefits and other options for treatment, the patient has consented to  Procedure(s): RIGHT/LEFT HEART CATH AND CORONARY ANGIOGRAPHY (N/A) as a surgical intervention.  The patient's history has been reviewed, patient examined, no change in status, stable for surgery.  I have reviewed the patient's chart and labs.  Questions were answered to the patient's satisfaction.     Erek Kowal J Dnya Hickle

## 2024-08-11 NOTE — Interval H&P Note (Signed)
 History and Physical Interval Note:  08/11/2024 11:59 AM  Darrell Simpson  has presented today for surgery, with the diagnosis of abnormal stress.  The various methods of treatment have been discussed with the patient and family. After consideration of risks, benefits and other options for treatment, the patient has consented to  Procedure(s): RIGHT/LEFT HEART CATH AND CORONARY ANGIOGRAPHY (N/A) as a surgical intervention.  The patient's history has been reviewed, patient examined, no change in status, stable for surgery.  I have reviewed the patient's chart and labs.  Questions were answered to the patient's satisfaction.     Kayleah Appleyard J Johnny Latu

## 2024-08-11 NOTE — Plan of Care (Signed)
  Problem: Education: Goal: Understanding of CV disease, CV risk reduction, and recovery process will improve Outcome: Progressing Goal: Individualized Educational Video(s) Outcome: Progressing   Problem: Activity: Goal: Ability to return to baseline activity level will improve Outcome: Progressing   Problem: Cardiovascular: Goal: Ability to achieve and maintain adequate cardiovascular perfusion will improve Outcome: Progressing Goal: Vascular access site(s) Level 0-1 will be maintained Outcome: Progressing   Problem: Health Behavior/Discharge Planning: Goal: Ability to safely manage health-related needs after discharge will improve Outcome: Progressing   Problem: Education: Goal: Knowledge of General Education information will improve Description: Including pain rating scale, medication(s)/side effects and non-pharmacologic comfort measures Outcome: Progressing   Problem: Health Behavior/Discharge Planning: Goal: Ability to manage health-related needs will improve Outcome: Progressing   Problem: Clinical Measurements: Goal: Ability to maintain clinical measurements within normal limits will improve Outcome: Progressing Goal: Will remain free from infection Outcome: Progressing Goal: Diagnostic test results will improve Outcome: Progressing Goal: Respiratory complications will improve Outcome: Progressing Goal: Cardiovascular complication will be avoided Outcome: Progressing   Problem: Activity: Goal: Risk for activity intolerance will decrease Outcome: Progressing   Problem: Nutrition: Goal: Adequate nutrition will be maintained Outcome: Progressing   Problem: Coping: Goal: Level of anxiety will decrease Outcome: Progressing   Problem: Elimination: Goal: Will not experience complications related to bowel motility Outcome: Progressing Goal: Will not experience complications related to urinary retention Outcome: Progressing   Problem: Pain Managment: Goal:  General experience of comfort will improve and/or be controlled Outcome: Progressing   Problem: Safety: Goal: Ability to remain free from injury will improve Outcome: Progressing   Problem: Skin Integrity: Goal: Risk for impaired skin integrity will decrease Outcome: Progressing   Problem: Education: Goal: Ability to demonstrate management of disease process will improve Outcome: Progressing Goal: Ability to verbalize understanding of medication therapies will improve Outcome: Progressing Goal: Individualized Educational Video(s) Outcome: Progressing   Problem: Activity: Goal: Capacity to carry out activities will improve Outcome: Progressing   Problem: Cardiac: Goal: Ability to achieve and maintain adequate cardiopulmonary perfusion will improve Outcome: Progressing

## 2024-08-11 NOTE — H&P (Signed)
 Cardiology Office Note:  .   Date:  08/11/2024  ID:  Darrell Simpson, DOB 08/05/1951, MRN [id number] PCP: Darrell Kettle, MD  Darrell Simpson Cardiologist:  Darrell Lawrence, MD PCP: Darrell Kettle, MD  No chief complaint on file.    Darrell Simpson is a 73 y.o. male with hypertension, multivessel CAD, HFrEF, former >60 PY smoker, CKD stage 3a, h/o squamous cell skin cancer, exertional dyspnea  History of Present Illness  Patient was originally seen by me in 2022 and found to have multivessel CAD, including occluded prox LAD. Due to his medical comorbidities including lung disease, he was deemed not to be a surgical candidate for CABG. He had an unsuccessful attempt to PCI to RCA. He udnerwent successful orbital atherectomy, PTCA and stent placement 3.5 X 34 mm Resolute Onyx drug-eluting stent to OM1, attempted percutaneous intervention LCx. Unable to advance stent, led to non-flow limiting dissection in prox LCx  Recently, patient has noticed bradycardia in 30s. Coreg  was stopped. Heart rate has improved since then. However, he has had worsening exertional dyspnea and leg edema recently.  This has since improved after increasing Lasix  dose.  Leg edema is resolved, but continues to have exertional dyspnea with usual activity.  He occasionally also has chest pain with emotional stress.    Patient udnerwent echcoardiogram and stress test that showed significant abnormalities, details below.    Vitals:   08/11/24 1006 08/11/24 1211  BP: (!) 147/89   Pulse: 74   Resp: 20   Temp: (!) 97.5 F (36.4 C)   SpO2: 94% 96%       Review of Systems  Cardiovascular:  Positive for chest pain and dyspnea on exertion. Negative for leg swelling, palpitations and syncope.        Studies Reviewed: SABRA        EKG 07/28/2024: Sinus rhythm with 1st degree A-V block with occasional Premature ventricular complexes Left axis deviation Left ventricular hypertrophy with QRS widening and  repolarization abnormality ( R in aVL , Cornell product , Romhilt-Estes ) Cannot rule out Septal infarct (cited on or before 30-Jan-2021) When compared with ECG of 14-Jun-2024 11:17, Premature ventricular complexes are now Present    PET/CT stress test 07/2024:   Severe partially reversible perfusion defect in apical to mid anterior/anteroseptal wall consistent with with infarct with peri-infarct ischemia.  In addition there is partially reversible perfusion defect in inferior wall. LVEF is severely reduced (25%).  TID is present, which is a high risk finding.  Myocardial blood flow reserve is markedly abnormal (1.02), though accuracy can be affected by prior coronary stenting.  Overall, findings suggest severe multivessel CAD and study is high risk.  Cardiac catheterization recommended   LV perfusion is abnormal. There is evidence of ischemia. There is evidence of infarction. Defect 1: There is a medium defect with severe reduction in uptake present in the apical to mid anterior, anteroseptal and apex location(s) that is partially reversible. There is abnormal wall motion in the defect area. Consistent with peri-infarct ischemia. Defect 2: There is a medium defect with moderate reduction in uptake present in the apical to basal inferior and inferoseptal location(s) that is partially reversible. There is abnormal wall motion in the defect area. Consistent with peri-infarct ischemia.   Rest left ventricular function is abnormal. Rest global function is severely reduced. Rest EF: 25%. Stress left ventricular function is abnormal. Stress global function is severely reduced. Stress EF: 29%. End diastolic cavity size is moderately enlarged. End systolic  cavity size is moderately enlarged.   Myocardial blood flow was computed to be 0.36ml/g/min at rest and 0.26ml/g/min at stress. Global myocardial blood flow reserve was 1.02 and was highly abnormal.   Coronary calcium  assessment not performed due to prior  revascularization.   Findings are consistent with infarction with peri-infarct ischemia. The study is high risk.  Echocardiogram 07/2024:  1. Apical hypokinesis. Left ventricular ejection fraction, by estimation,  is 30 to 35%. The left ventricle has moderately decreased function. The  left ventricle demonstrates global hypokinesis. There is mild left  ventricular hypertrophy. Left ventricular diastolic parameters are consistent with Grade I diastolic dysfunction (impaired relaxation).   2. Right ventricular systolic function is mildly reduced. The right  ventricular size is normal.   3. The mitral valve is normal in structure. No evidence of mitral valve  regurgitation. No evidence of mitral stenosis.   4. The aortic valve is normal in structure. Aortic valve regurgitation is  mild. No aortic stenosis is present.   5. Aortic dilatation noted. There is mild dilatation of the ascending  aorta, measuring 42 mm.   6. The inferior vena cava is normal in size with greater than 50%  respiratory variability, suggesting right atrial pressure of 3 mmHg.   Coronary intervention 2022: LM: Distal LM 40% stenosis LAD: Prox occlusion LCx: Severe calcification throughout the vessel         Prox calcific 80% stenosis         OM1 Ostial 60%, followed by prox 90% stenosis   Successful percutaneous coronary intervention OM1 Orbital atherectomy, PTCA and stent placement 3.5 X 34 mm Resolute Onyx drug-eluting stent Attempted percutaneous intervention LCx. Unable to advance stent. Non-flow limiting dissection in prox Lcx  Echocardiogram 2023: Normal LV systolic function with visual EF 50-55%. Left ventricle cavity is normal in size. Mild left ventricular hypertrophy. Normal global wall motion. Doppler evidence of grade I (impaired) diastolic dysfunction, normal LAP. Mild tricuspid regurgitation. No evidence of pulmonary hypertension. Compared to 04/20/2021 no significant  change.  Labs 06/14/2024: Chol 115, TG 113, HDL 43, LDL 51 Cr 1.73, eGFR 51, K 5.2 ProBNP 1302   Physical Exam Vitals and nursing note reviewed.  Constitutional:      General: He is not in acute distress. Neck:     Vascular: No JVD.  Cardiovascular:     Rate and Rhythm: Normal rate and regular rhythm.     Heart sounds: Normal heart sounds. No murmur heard. Pulmonary:     Effort: Pulmonary effort is normal.     Breath sounds: No wheezing or rales.  Musculoskeletal:     Right lower leg: No edema.     Left lower leg: No edema.      VISIT DIAGNOSES: No diagnosis found.     Darrell Simpson is a 73 y.o. male with hypertension, multivessel CAD, HFrEF, former >60 PY smoker, CKD stage 3a, h/o squamous cell skin cancer, h/o COVID pneumonia in 11/2020 with residual fibrotic changes, possible interstitial lung disease  Assessment & Plan  CAD: Multivessel CAD with LAD CTO.  Severe proximal RCA and distal LM/proximal LCx/OM1 disease (01/2021) Not a surgical candidate.  Unsuccessful revascularization attempt of proximal RCA (01/2021) Successful atherectomy and PCI to OM (01/2021).  Unable to deliver stent to circumflex with nonflow limiting dissection. Now with worsening LVEF 30-35% w/high risk stress test, Continue Aspirin  81 mg daily. Continue Lipitor  80 mg daily, Ranexa  1000 mg twice daily. Increase carvedilol  back to 6.25 mg twice daily. Stress test  with multiple areas of ischemia as detailed above.  He has had at least 1 episode of chest pain with emotional stress.  I reckon he is not having exertional angina as his physical activity is limited due to exertional dyspnea.  I recommend checking labs today.  If renal function will allow, he would need left and right heart catheterization and likely heart team discussion regarding revascularization, should he have recurrent anginal symptoms.     Informed Consent   Shared Decision Making/Informed Consent The risks [stroke (1 in 1000),  death (1 in 1000), kidney failure [usually temporary] (1 in 500), bleeding (1 in 200), allergic reaction [possibly serious] (1 in 200)], benefits (diagnostic support and management of coronary artery disease) and alternatives of a cardiac catheterization were discussed in detail with Mr. Parmelee and he is willing to proceed.      HFrEF: EF 25-30%. Entresto  was held due to increased creatinine.  Continue Lasix , Farxiga , carvedilol .          Meds ordered this encounter  Medications   aspirin  chewable tablet 81 mg   free water 250 mL   sodium chloride  flush (NS) 0.9 % injection 3 mL   sodium chloride  flush (NS) 0.9 % injection 3 mL   0.9 %  sodium chloride  infusion   DISCONTD: 0.9 %  sodium chloride  infusion   Heparin  (Porcine) in NaCl 1000-0.9 UT/500ML-% SOLN     F/u in 6-8 weeks  Signed, Darrell JINNY Lawrence, MD

## 2024-08-12 ENCOUNTER — Encounter (HOSPITAL_COMMUNITY): Payer: Self-pay | Admitting: Cardiology

## 2024-08-12 ENCOUNTER — Telehealth (HOSPITAL_COMMUNITY): Payer: Self-pay | Admitting: Pharmacy Technician

## 2024-08-12 ENCOUNTER — Inpatient Hospital Stay (HOSPITAL_COMMUNITY)

## 2024-08-12 ENCOUNTER — Other Ambulatory Visit (HOSPITAL_COMMUNITY): Payer: Self-pay

## 2024-08-12 DIAGNOSIS — I5020 Unspecified systolic (congestive) heart failure: Secondary | ICD-10-CM | POA: Diagnosis not present

## 2024-08-12 LAB — COOXEMETRY PANEL
Carboxyhemoglobin: 3.3 % — ABNORMAL HIGH (ref 0.5–1.5)
Methemoglobin: <0.7 % (ref 0.0–1.5)
O2 Saturation: 70.8 %
Total hemoglobin: 15.7 g/dL (ref 12.0–16.0)

## 2024-08-12 LAB — CBC
HCT: 43.8 % (ref 39.0–52.0)
Hemoglobin: 15.4 g/dL (ref 13.0–17.0)
MCH: 32.8 pg (ref 26.0–34.0)
MCHC: 35.2 g/dL (ref 30.0–36.0)
MCV: 93.2 fL (ref 80.0–100.0)
Platelets: 138 K/uL — ABNORMAL LOW (ref 150–400)
RBC: 4.7 MIL/uL (ref 4.22–5.81)
RDW: 13.2 % (ref 11.5–15.5)
WBC: 7.2 K/uL (ref 4.0–10.5)
nRBC: 0 % (ref 0.0–0.2)

## 2024-08-12 LAB — BASIC METABOLIC PANEL WITH GFR
Anion gap: 11 (ref 5–15)
BUN: 19 mg/dL (ref 8–23)
CO2: 22 mmol/L (ref 22–32)
Calcium: 8.2 mg/dL — ABNORMAL LOW (ref 8.9–10.3)
Chloride: 104 mmol/L (ref 98–111)
Creatinine, Ser: 1.75 mg/dL — ABNORMAL HIGH (ref 0.61–1.24)
GFR, Estimated: 41 mL/min — ABNORMAL LOW (ref >60)
Glucose, Bld: 119 mg/dL — ABNORMAL HIGH (ref 70–99)
Potassium: 3 mmol/L — ABNORMAL LOW (ref 3.5–5.1)
Sodium: 137 mmol/L (ref 135–145)

## 2024-08-12 LAB — MAGNESIUM: Magnesium: 1.9 mg/dL (ref 1.7–2.4)

## 2024-08-12 LAB — MRSA NEXT GEN BY PCR, NASAL: MRSA by PCR Next Gen: NOT DETECTED

## 2024-08-12 MED ORDER — MAGNESIUM SULFATE 2 GM/50ML IV SOLN
2.0000 g | Freq: Once | INTRAVENOUS | Status: AC
  Administered 2024-08-12: 2 g via INTRAVENOUS
  Filled 2024-08-12: qty 50

## 2024-08-12 MED ORDER — POTASSIUM CHLORIDE CRYS ER 20 MEQ PO TBCR
40.0000 meq | EXTENDED_RELEASE_TABLET | ORAL | Status: AC
  Administered 2024-08-12 (×2): 40 meq via ORAL
  Filled 2024-08-12 (×2): qty 2

## 2024-08-12 MED ORDER — SILDENAFIL CITRATE 20 MG PO TABS
20.0000 mg | ORAL_TABLET | Freq: Three times a day (TID) | ORAL | Status: DC
  Administered 2024-08-12 – 2024-08-13 (×5): 20 mg via ORAL
  Filled 2024-08-12 (×5): qty 1

## 2024-08-12 MED ORDER — SPIRONOLACTONE 25 MG PO TABS
25.0000 mg | ORAL_TABLET | Freq: Every day | ORAL | Status: DC
  Administered 2024-08-12 – 2024-08-13 (×2): 25 mg via ORAL
  Filled 2024-08-12 (×2): qty 1

## 2024-08-12 MED ORDER — CHLORHEXIDINE GLUCONATE CLOTH 2 % EX PADS
6.0000 | MEDICATED_PAD | Freq: Every day | CUTANEOUS | Status: DC
  Administered 2024-08-12 – 2024-08-13 (×2): 6 via TOPICAL

## 2024-08-12 MED ORDER — CHLORHEXIDINE GLUCONATE CLOTH 2 % EX PADS
6.0000 | MEDICATED_PAD | Freq: Once | CUTANEOUS | Status: AC
  Administered 2024-08-12: 6 via TOPICAL

## 2024-08-12 NOTE — TOC Initial Note (Signed)
 Transition of Care [facility]) - Initial/Assessment Note    Patient Details  Name: Darrell Simpson MRN: [id number] Date of Birth: [birth year]  Transition of Care [facility]) CM/SW Contact:    Arlana JINNY Nicholaus [place] Phone Number: [phone] 08/12/2024, 2:08 PM  Clinical Narrative:  HF CSW met with patient at bedside. Patient stated that he lives at home with spouse. Patient stated that he has no history of HH services. Patient he uses oxygen  at home. Patient stated that he has a cane and walker but does not use it. Patient stated that he has scale. Patient stated that he has a PCP. CSW explained that a hospital follow up is typically scheduled closer towards dc. Patient stated that his wife will provide transportation at dc.   HF CSW/CM will continue to follow and monitor dc readiness.                   Patient Goals and CMS Choice            Expected Discharge Plan and Services                                              Prior Living Arrangements/Services                       Activities of Daily Living   ADL Screening (condition at time of admission) Independently performs ADLs?: Yes (appropriate for developmental age) Is the patient deaf or have difficulty hearing?: No Does the patient have difficulty seeing, even when wearing glasses/contacts?: No Does the patient have difficulty concentrating, remembering, or making decisions?: No  Permission Sought/Granted                  Emotional Assessment              Admission diagnosis:  HFrEF (heart failure with reduced ejection fraction) (HCC) [I50.20] CHF (congestive heart failure) (HCC) [I50.9] Patient Active Problem List   Diagnosis Date Noted   CHF (congestive heart failure) (HCC) 08/11/2024   Bradycardia 06/14/2024   Exertional dyspnea 06/14/2024   Leg edema 06/14/2024   Mixed hyperlipidemia 09/20/2021   Post PTCA 01/30/2021   Coronary artery disease 01/29/2021   HFrEF (heart failure with  reduced ejection fraction) (HCC) 01/23/2021   H/O non-ST elevation myocardial infarction (NSTEMI) 01/23/2021   Non-ST elevation (NSTEMI) myocardial infarction [facility]) 01/11/2021   NSTEMI (non-ST elevated myocardial infarction) (HCC) 01/11/2021   PCP:  Norval Kettle, MD Pharmacy:   [facility] [address] [place] [address] Phone: [phone] Fax: [phone]  [id number] - [place], [place] - [address] AT [facility] [address] [place] [address] Phone: [phone] Fax: [phone]  [facility] - [place], [place] - [address] [place] [address] Phone: [phone] Fax: [phone]     Social Drivers of Health (SDOH) Social History: SDOH Screenings   Food Insecurity: No Food Insecurity (08/12/2024)  Housing: Low Risk  (08/12/2024)  Transportation Needs: No Transportation Needs (08/12/2024)  Utilities: Not At Risk (08/12/2024)  Social Connections: Moderately Integrated (08/12/2024)  Tobacco Use: Medium Risk (08/12/2024)   SDOH Interventions:     Readmission Risk Interventions     No data to display

## 2024-08-12 NOTE — Telephone Encounter (Signed)
 Pharmacy Patient Advocate Encounter   Received notification from Inpatient Request that prior authorization for Sildenafil Citrate (PAH) 20MG  tablets  is required/requested.   Insurance verification completed.   The patient is insured through [employer].   Per test claim: PA required; PA submitted to above mentioned insurance via Latent Key/confirmation #/EOC [id number] Status is pending

## 2024-08-12 NOTE — Evaluation (Signed)
 Physical Therapy Evaluation Patient Details Name: Darrell Simpson MRN: [id number] DOB: February 22, 1951 Today's Date: 08/12/2024  History of Present Illness  Darrell Simpson is a 73 y.o. male admitted 10/1 with CHF.  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 Pt was able to march in place with CGA/Supervision at EOB but declined walking due to fatigue. Pt reports he walks only short distances at home. May benefit from rollator use and likely will not need any f/u therapy.  Pt currently with functional limitations due to the deficits listed below (see PT Problem List). Pt will benefit from acute skilled PT to increase their independence and safety with mobility to allow discharge.           If plan is discharge home, recommend the following: Assistance with cooking/housework;Assist for transportation   Can travel by private Data processing manager (4 wheels)  Recommendations for Other Services       Functional Status Assessment Patient has had a recent decline in their functional status and demonstrates the ability to make significant improvements in function in a reasonable and predictable amount of time.     Precautions / Restrictions Precautions Precautions: Fall Restrictions Weight Bearing Restrictions Per Provider Order: No      Mobility  Bed Mobility Overal bed mobility: Independent                  Transfers Overall transfer level: Independent                 General transfer comment: Pt marched in place. Pt stated he cannot walk furhter than door on a good day.  He states he walks short distances at home and has walked to and from bathroom today. Declined walking to door during session as he states he is too tired. No LOB with standing and marching in place.    Ambulation/Gait                  Stairs             Wheelchair Mobility     Tilt Bed    Modified Rankin (Stroke Patients Only)       Balance Overall balance assessment: Needs assistance Sitting-balance support: No upper extremity supported, Feet supported Sitting balance-Leahy Scale: Fair     Standing balance support: No upper extremity supported, During functional activity Standing balance-Leahy Scale: Fair                               Pertinent Vitals/Pain Pain Assessment Pain Assessment: No/denies pain    Home Living Family/patient expects to be discharged to:: Private residence Living Arrangements: Spouse/significant other Available Help at Discharge: Family;Available 24 hours/day Type of Home: House Home Access: Stairs to enter Entrance Stairs-Rails: None Entrance Stairs-Number of Steps: 2   Home Layout: One level Home Equipment: Cane - single point;Electric scooter;Shower seat (Home O2 2L prn, pulse oximeter)      Prior Function               Mobility Comments: doesnt use cane but has one, uses scooter in community; short distance ambulation only per pt ADLs Comments: I B/D     Extremity/Trunk Assessment   Upper Extremity Assessment Upper Extremity Assessment: Defer to OT evaluation  Lower Extremity Assessment Lower Extremity Assessment: Generalized weakness    Cervical / Trunk Assessment Cervical / Trunk Assessment: Normal  Communication   Communication Communication: No apparent difficulties    Cognition Arousal: Alert Behavior During Therapy: WFL for tasks assessed/performed   PT - Cognitive impairments: No apparent impairments                         Following commands: Intact       Cueing       General Comments General comments (skin integrity, edema, etc.): 61 bpm, 94%% 2L    Exercises General Exercises - Lower Extremity Hip Flexion/Marching: AROM, Both, 10 reps, Standing   Assessment/Plan    PT Assessment Patient needs continued PT  services  PT Problem List Decreased activity tolerance;Decreased balance;Decreased mobility;Decreased knowledge of use of DME;Decreased safety awareness;Decreased knowledge of precautions;Cardiopulmonary status limiting activity       PT Treatment Interventions DME instruction;Functional mobility training;Therapeutic exercise;Therapeutic activities;Balance training;Patient/family education;Gait training;Stair training    PT Goals (Current goals can be found in the Care Plan section)  Acute Rehab PT Goals Patient Stated Goal: to go home PT Goal Formulation: With patient Time For Goal Achievement: 08/26/24 Potential to Achieve Goals: Good    Frequency Min 2X/week     Co-evaluation               AM-PAC PT 6 Clicks Mobility  Outcome Measure Help needed turning from your back to your side while in a flat bed without using bedrails?: None Help needed moving from lying on your back to sitting on the side of a flat bed without using bedrails?: None Help needed moving to and from a bed to a chair (including a wheelchair)?: None Help needed standing up from a chair using your arms (e.g., wheelchair or bedside chair)?: A Little Help needed to walk in hospital room?: A Little Help needed climbing 3-5 steps with a railing? : A Little 6 Click Score: 21    End of Session Equipment Utilized During Treatment: Gait belt;Oxygen  Activity Tolerance: Patient limited by fatigue Patient left: in bed;with call bell/phone within reach Nurse Communication: Mobility status PT Visit Diagnosis: Muscle weakness (generalized) (M62.81)    Time: 8987-8977 PT Time Calculation (min) (ACUTE ONLY): 10 min   Charges:   PT Evaluation $PT Eval Low Complexity: 1 Low   PT General Charges $$ ACUTE PT VISIT: 1 Visit         Keosha Rossa M,PT Acute Rehab Services [phone]   Stephane JULIANNA Bevel 08/12/2024, 2:08 PM

## 2024-08-12 NOTE — Discharge Summary (Incomplete)
 Advanced Heart Failure Team  Discharge Summary   Patient ID: Darrell Simpson MRN: [id number], DOB/AGE: 1951/06/06 73 y.o. 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  Admitted 08/11/24 after R/LHC with Dr. Elmira showing 50% dLM, diffuse 50-80% calcified LAD followed by comlpete occlusion with faint collaterals from Cx, 60% in-sstent restenosis of OM1 stent, 90% dCX stenosis, and 90% prox RCA stenosis. RHC 10/1: RA 7, PA 88/38 (50), PCWP 17, CO/CI 3.9/1.6. Underwent high-res CT  which showed evidence of UOP, emphysematous changes, mediastinal lymphadenopathy. He was not felt to be a candidate for CABG. After discussion with Interventional Cardiology team it was decided to manage CAD medically. If has angina in the future could consider options for PCI. Dyspnea probably multifactorial, but pulmonary hypertension likely playing a large role (has severely reduced DLCO). He was diuresed and started on sildenafil for pulmonary HTN. GDMT for HF titrated. He will be referred for pulmonary rehab at [facility] in [place].   Close follow-up in HF clinic arranged. 30 day supply of medications sent to [facility] pharmacy.   Discharge Weight: 265 lb  Discharge Vitals: Blood pressure (!) (P) 156/85, pulse 63, temperature 97.8 F (36.6 C), temperature source Oral, resp. rate 20, height 6' (1.829 m), weight 120.6 kg, SpO2 97%.  General:  Chronically ill appearing Cor: JVP difficult d/t thick neck. Regular rate & rhythm. No murmurs. Lungs: clear Abdomen: obsese, soft, nontender, nondistended. Extremities: no  edema Neuro: alert & orientedx3. Affect pleasant   Labs: Lab Results  Component Value Date   WBC 7.0 08/13/2024   HGB 14.3 08/13/2024   HCT 40.6 08/13/2024   MCV 93.3 08/13/2024   PLT 143 (L) 08/13/2024    Recent Labs  Lab 08/13/24 0341  NA 138  K 3.7  CL 108  CO2 22  BUN 15  CREATININE 1.40*  CALCIUM  8.2*  GLUCOSE 90   Lab Results  Component Value Date   CHOL 115 06/14/2024   HDL 43 06/14/2024   LDLCALC 51 06/14/2024   TRIG 113 06/14/2024   BNP (last 3 results) Recent Labs    08/11/24 1759  BNP 616.5*    ProBNP (last 3 results) Recent Labs    06/14/24 1251 07/28/24 1652  PROBNP 1,302* 4,553*     Diagnostic Studies/Procedures   CT Chest High Resolution Result Date: 08/12/2024 CLINICAL DATA:  Diffuse interstitial lung disease. EXAM: CT CHEST WITHOUT CONTRAST TECHNIQUE: Multidetector CT imaging of the chest was performed following the standard protocol without intravenous contrast. High resolution imaging of the lungs, as well as inspiratory and expiratory imaging, was performed. RADIATION DOSE REDUCTION: This exam was performed according to the departmental dose-optimization program which includes automated exposure control, adjustment of the mA and/or kV according to patient size and/or use of iterative reconstruction technique. COMPARISON:  September 02, 2023.  PET-CT July 27, 2024. FINDINGS: Cardiovascular: Mild cardiomegaly. Atherosclerotic calcifications of the coronary arteries and stents. No pericardial effusion. Pulmonary artery is slightly enlarged measuring 3.3 cm. 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 Right PICC tip terminates in cavoatrial junction Mediastinum/Nodes: Multi station prominent mediastinal lymph  nodes measuring up to 16 mm in prevascular, bilateral upper and lower paratracheal, subcarinal and nodal stations. Normal thyroid gland. Esophagus is unremarkable. Lungs/Pleura: Upper lobe predominant moderate centrilobular  emphysematous changes. Predominantly in lower lobes without significant honeycombing. No air trapping on expiratory phase. Mild diffuse ground-glass attenuation. No significant consolidation or suspicious pulmonary nodule. No pleural effusion. Mild bronchial and bronchiolar wall thickening Upper Abdomen: Punctate calcifications of the spleen suggestive of prior granulomatous disease. . Musculoskeletal: Multilevel degenerative changes spine. IMPRESSION: Findings are categorized as probable UIP per consensus guidelines: Diagnosis of Idiopathic Pulmonary Fibrosis: An Official ATS/ERS/JRS/ALAT Clinical Practice Guideline. Am JINNY [employer] Crit Care Med Vol 198, Iss 5, [phone], Jul 12 2017. Subcentimeter and pericentimeter mediastinal lymphadenopathy, likely reactive. Enlarged pulmonary artery which can be seen the setting of pulmonary arterial hypertension. Aneurysmal dilation of ascending aorta. Upper lobe predominant moderate centrilobular emphysematous changes. . Electronically Signed   By: Megan  Zare M.D.   On: 08/12/2024 18:38   US  EKG SITE RITE Result Date: 08/11/2024 If Site Rite image not attached, placement could not be confirmed due to current cardiac rhythm.   Discharge Medications   Allergies as of 08/13/2024       Reactions   Penicillins Rash   Sulfa Antibiotics Rash        Medication List     STOP taking these medications    carvedilol  6.25 MG tablet Commonly known as: COREG    nitroGLYCERIN  0.4 MG SL tablet Commonly known as: NITROSTAT    OXYGEN        TAKE these medications    albuterol  108 (90 Base) MCG/ACT inhaler Commonly known as: VENTOLIN  HFA Inhale 2 puffs into the lungs every 4 (four) hours as needed for wheezing or shortness of breath.   aspirin  EC 81 MG tablet Take 1 tablet (81 mg total) by mouth daily. Swallow whole.   atorvastatin  80 MG tablet Commonly known as: LIPITOR  TAKE 1 TABLET BY MOUTH ONCE DAILY   Breztri Aerosphere 160-9-4.8 MCG/ACT Aero  inhaler Generic drug: budesonide-glycopyrrolate-formoterol  Inhale 1 puff into the lungs daily.   dapagliflozin  propanediol 10 MG Tabs tablet Commonly known as: Farxiga  Take 1 tablet (10 mg total) by mouth daily before breakfast. Start taking on: August 14, 2024   furosemide  40 MG tablet Commonly known as: Lasix  Take 1 tablet (40 mg total) by mouth daily as needed. What changed:  when to take this reasons to take this   HYDROcodone -acetaminophen  10-325 MG tablet Commonly known as: NORCO Take 1 tablet by mouth every 4 (four) hours as needed for moderate pain.   ipratropium-albuterol  0.5-2.5 (3) MG/3ML Soln Commonly known as: DUONEB Take 3 mLs by nebulization 4 (four) times daily. What changed:  when to take this reasons to take this   pantoprazole  40 MG tablet Commonly known as: PROTONIX  TAKE ONE TABLET BY MOUTH DAILY What changed:  when to take this reasons to take this   potassium chloride SA 20 MEQ tablet Commonly known as: KLOR-CON M Take 2 tablets (40 mEq total) by mouth daily as needed. When taking PRN lasix    ranolazine  1000 MG SR tablet Commonly known as: RANEXA  TAKE 1 TABLET BY MOUTH TWICE DAILY   sacubitril -valsartan  24-26 MG Commonly known as: ENTRESTO  Take 1 tablet by mouth 2 (two) times daily.   sildenafil 20 MG tablet Commonly known as: REVATIO Take 1 tablet (20 mg total) by mouth 3 (three) times daily.   spironolactone 25 MG tablet Commonly known as: ALDACTONE Take 1 tablet (25 mg total) by mouth  daily. Start taking on: August 14, 2024   Symbicort 160-4.5 MCG/ACT inhaler Generic drug: budesonide-formoterol  Inhale 2 puffs into the lungs. What changed: when to take this   Vitamin B 12 500 MCG Tabs Take by mouth as needed. Pt takes 1-2 x a month What changed:  how much to take when to take this additional instructions   Vitamin D (Ergocalciferol) 1.25 MG (50000 UNIT) Caps capsule Commonly known as: DRISDOL Take 50,000 Units by mouth once  a week.               Durable Medical Equipment  (From admission, onward)           Start     Ordered   08/13/24 1457  For home use only DME 4 wheeled rolling walker with seat  Once       Question Answer Comment  Patient needs a walker to treat with the following condition Heart failure [facility])   Patient needs a walker to treat with the following condition Physical deconditioning   Patient needs a walker to treat with the following condition Pulmonary hypertension (HCC)      08/13/24 1458            Disposition   The patient will be discharged in stable condition to home. Discharge Instructions     AMB referral to pulmonary rehabilitation   Complete by: As directed    Please select a program: Pulmonary Rehabilitation   Diagnosis: (See process instructions below for COPD requirements): COPD-Gold 3: Severe   30% </= FEV1 <50% predicted   After initial evaluation and assessments completed: Virtual Based Care may be provided alone or in conjunction with Pulmonary Rehab/Respiratory Care services based on patient barriers.: Yes   Diet - low sodium heart healthy   Complete by: As directed    Increase activity slowly   Complete by: As directed        Follow-up Information     [facility] Follow up on 08/27/2024.   Specialty: Cardiology Why: Advanced Heart Failure Clinic 3:30 PM entrance c, free valet parking Contact information: [address] [place]  [address] [phone]        Norval Kettle, MD Follow up.   Specialty: Internal Medicine Why: please call to arrange hospital follow up appt in 7-14 days Contact information: [address] [place] [address] [phone]         [place], [facility]  Follow up.   Why: Home Health Physical Therapy and Occupational Therapy-agency will call to arrange appt Contact information: [address] [place]  [address] [phone]                   Duration of Discharge Encounter: 40 minutes  FINCH, LINDSAY N, PA-C 08/13/2024, 3:48 PM  Patient seen and examined with the above-signed Advanced Practice Provider and/or Housestaff. I personally reviewed laboratory data, imaging studies and relevant notes. I independently examined the patient and formulated the important aspects of the plan. I have edited the note to reflect any of my changes or salient points. I have personally discussed the plan with the patient and/or family.  Denied CP or SOB  Eager to go home  General:  Sitting up in bed No resp difficulty HEENT: normal Neck: supple. no JVD. Carotids 2+ bilat; no bruits. No lymphadenopathy or thryomegaly appreciated. Cor:  Regular rate & rhythm. No rubs, gallops or murmurs. Lungs: clear but decreased  Abdomen: obese soft, nontender, nondistended. No hepatosplenomegaly. No bruits or masses. Good bowel sounds. Extremities: no cyanosis, clubbing, rash, edema Neuro: alert & orientedx3, cranial nerves grossly intact. moves all 4 extremities w/o difficulty. Affect pleasant  Case discussed at cath conference this am.   CAD felt to be relatively stable from last cath (including LM disease).   Hi-res CT with mild COPD and ILD. PFTs with mildly decreased spirometry and severe decrease DLCO.   Suspect dyspnea likely most influenced by pulmonary HTN, deconditioning and HF  Will treat CAD medically for now unless he has progressive angina   Have started sildenafil for PH, Will need to be careful with HF. If he responds can consider soatercept which may also have some benefit in WHO Group II PH.   Refer Pulmonary Rehab in [place].   D/c time today 51 mins  Toribio Fuel, MD  9:52 PM

## 2024-08-12 NOTE — Progress Notes (Signed)
Heart Failure Navigator Progress Note  Assessed for Heart & Vascular TOC clinic readiness.  Patient does not meet criteria due to Advanced Heart Failure Team consulted with Dr. Gala Romney.   Navigator will sign off at this time.   Rhae Hammock, BSN, Scientist, clinical (histocompatibility and immunogenetics) Only

## 2024-08-12 NOTE — Progress Notes (Signed)
 OT Cancellation Note  Patient Details Name: QUASHAUN LAZALDE MRN: [id number] DOB: 17-Oct-1951   Cancelled Treatment:    Reason Eval/Treat Not Completed: Other (comment);Fatigue/lethargy limiting ability to participate (spoke to PT and recomendation to wait for tomorrow due to level of fatigue. Will follow up.)  Warrick POUR OTR/L  Acute Rehab Services  [phone] office number   Warrick Berber 08/12/2024, 10:23 AM

## 2024-08-12 NOTE — H&P (Incomplete)
 Cardiology Admission History and Physical   Patient ID: Darrell Simpson MRN: [id number]; DOB: 1951/09/14   Admission date: 08/11/2024  PCP:  Norval Kettle, MD   [facility] Providers Cardiologist:  Newman JINNY Lawrence, MD     Chief Complaint:  pulmonary HTN/CHF/CAD  Patient Profile: Darrell Simpson is a 73 y.o. 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  History of Present Illness: Darrell Simpson is a 73 year old male with past medical history noted above.  He has been followed by Dr. Lawrence as an outpatient.  Initially seen back in 2022 and underwent heart catheterization found to have multivessel CAD including occluded proximal LAD.  He was evaluated by CT surgery but deemed not to be an appropriate surgical candidate secondary to his lung disease.  He had unsuccessful attempt at PCI to RCA but did undergo successful orbital atherectomy PTCA and stenting to OM1.  Attempts were made to intervene on the circumflex but unable to advance stent which led to nonflow limiting dissection in the proximal circumflex.  Had bradycardia while on Coreg  therefore this was stopped.  He had complained of worsening exertional dyspnea and leg edema which improved after increasing his Lasix  dose.  Followed by pulmonology, Dr. 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  He is maintained on home O2 at 2 L, as well as CPAP.  Underwent outpatient cardiac PET 07/2024 with severe partially reversible perfusion defect in the apical to mid anterior/anterior septal wall consistent with infarct, peri-infarct ischemia.  Study overall suggestive of severe multivessel CAD and considered high risk with recommendations for cardiac catheterization.   Echocardiogram at that time showed LVEF of 30 to 35%, apical hypokinesis, mild LVH, grade 1 diastolic dysfunction, mildly reduced RV with no significant valvular disease.  He was seen in the office by Dr. Lawrence on 9/17 and reported that his lower extremity edema had gotten better but continued to have exertional dyspnea as well as chest pain with emotional stress.  Given his recent cardiac studies as well as symptoms it is recommended that he undergo outpatient cardiac catheterization.  Underwent right and left heart cath 10/1 with significant multivessel disease including 50% distal left main, diffuse 50 to 80% calcified proximal LAD followed by complete occlusion with faint collaterals filling septal vessels from circumflex, ostial/proximal mildly calcific 75% stenosis at bifurcation with large OM 1 vessel with patent stent and 60% ostial in-stent restenosis, 90% distal circumflex stenosis, proximal RCA 90% stenosis.   Right heart catheterization 08/11/2024: RA: 7 mmHg RV: 81/2 mmHg PA: 88/38 mmHg, mPAP 50 mmHg PCW: 17 mmHg   AO sats: 95% PA sats: 63%   CO: 3.9 L/min CI: 1.6 L/min/m2  Recommendations for admission with evaluation by advanced heart failure for further optimization, and discussion at heart team meeting on Friday.  Past Medical History:  Diagnosis Date  . Arthritis    psioriatic arthritis   . Cancer (HCC)    squamous cell skin cancer right cheek   . CHF (congestive heart failure) (HCC)   . Chronic kidney disease    back to 80% func- improving per pt   . Diverticulosis   . GERD (gastroesophageal reflux disease)   . Hypertension    Past Surgical History:  Procedure  Laterality Date  . COLONOSCOPY    . CORONARY ATHERECTOMY N/A 01/30/2021   Procedure: CORONARY ATHERECTOMY;  Surgeon: Elmira Newman PARAS, MD;  Location: [facility];  Service: Cardiovascular;  Laterality: N/A;  . CORONARY BALLOON ANGIOPLASTY N/A 01/15/2021   Procedure: CORONARY BALLOON  ANGIOPLASTY;  Surgeon: Elmira Newman PARAS, MD;  Location: [facility];  Service: Cardiovascular;  Laterality: N/A;  . CORONARY STENT INTERVENTION N/A 01/30/2021   Procedure: CORONARY STENT INTERVENTION;  Surgeon: Elmira Newman PARAS, MD;  Location: [facility];  Service: Cardiovascular;  Laterality: N/A;  . CORONARY ULTRASOUND/IVUS N/A 01/30/2021   Procedure: Intravascular Ultrasound/IVUS;  Surgeon: Elmira Newman PARAS, MD;  Location: [facility];  Service: Cardiovascular;  Laterality: N/A;  . EYE SURGERY     age 62   . LEFT HEART CATH N/A 01/15/2021   Procedure: Left Heart Cath;  Surgeon: Elmira Newman PARAS, MD;  Location: [facility];  Service: Cardiovascular;  Laterality: N/A;  . LEFT HEART CATH AND CORONARY ANGIOGRAPHY N/A 01/12/2021   Procedure: LEFT HEART CATH AND CORONARY ANGIOGRAPHY;  Surgeon: Elmira Newman PARAS, MD;  Location: [facility];  Service: Cardiovascular;  Laterality: N/A;  . POLYPECTOMY       Medications Prior to Admission: Prior to Admission medications   Medication Sig Start Date End Date Taking? Authorizing Provider  albuterol  (VENTOLIN  HFA) 108 (90 Base) MCG/ACT inhaler Inhale 2 puffs into the lungs every 4 (four) hours as needed for wheezing or shortness of breath.   Yes [provider]  aspirin  81 MG EC tablet Take 1 tablet (81 mg total) by mouth daily. Swallow whole. 06/04/21  Yes Patwardhan, Manish J, MD  atorvastatin  (LIPITOR ) 80 MG tablet TAKE 1 TABLET BY MOUTH ONCE DAILY 06/15/24  Yes Patwardhan, Manish J, MD  budesonide-glycopyrrolate-formoterol  (BREZTRI AEROSPHERE) 160-9-4.8 MCG/ACT AERO inhaler Inhale 1 puff into the lungs daily. 07/16/24  Yes [provider]  carvedilol  (COREG ) 6.25 MG tablet Take 1 tablet (6.25 mg total) by mouth 2 (two) times daily. 07/28/24  Yes Patwardhan, Manish J, MD  Cyanocobalamin (VITAMIN B 12) 500 MCG TABS Take by mouth as needed. Pt takes 1-2 x a month Patient taking differently: Take  500 mcg by mouth daily.   Yes [provider]  dapagliflozin  propanediol (FARXIGA ) 10 MG TABS tablet Take 1 tablet (10 mg total) by mouth daily before breakfast. 06/16/24  Yes Patwardhan, Manish J, MD  furosemide  (LASIX ) 40 MG tablet Take 1 tablet (40 mg total) by mouth daily. Patient taking differently: Take 80 mg by mouth daily. 06/14/24  Yes Patwardhan, Newman PARAS, MD  HYDROcodone -acetaminophen  (NORCO) 10-325 MG tablet Take 1 tablet by mouth every 4 (four) hours as needed for moderate pain. 06/14/20  Yes [provider]  nitroGLYCERIN  (NITROSTAT ) 0.4 MG SL tablet Place 1 tablet (0.4 mg total) under the tongue every 5 (five) minutes x 3 doses as needed for chest pain. 07/28/24  Yes Patwardhan, Manish J, MD  OXYGEN  Inhale 2 L into the lungs continuous.   Yes [provider]  ranolazine  (RANEXA ) 1000 MG SR tablet TAKE 1 TABLET BY MOUTH TWICE DAILY 06/15/24  Yes Patwardhan, Newman PARAS, MD  SYMBICORT 160-4.5 MCG/ACT inhaler Inhale 2 puffs into the lungs. Patient taking differently: Inhale 2 puffs into the lungs daily. 02/03/23  Yes [provider]  Vitamin D, Ergocalciferol, (DRISDOL) 1.25 MG (50000 UNIT) CAPS capsule Take 50,000 Units by mouth once a week. 06/14/20  Yes [provider]  ipratropium-albuterol  (DUONEB) 0.5-2.5 (3) MG/3ML SOLN Take 3 mLs by nebulization 4 (four) times daily. Patient taking differently: Take 3 mLs by nebulization every 4 (four) hours as needed (emphysema). 01/16/21   Patwardhan, Newman PARAS, MD  pantoprazole  (PROTONIX ) 40 MG tablet TAKE ONE TABLET BY MOUTH DAILY Patient taking differently: Take 40 mg by mouth daily as needed (Heartburn). 07/31/23   Ladona Heinz, MD  sacubitril -valsartan  (ENTRESTO ) 24-26 MG TAKE 1 TABLET BY MOUTH 2 TIMES DAILY Patient not taking: Reported on 08/09/2024 04/21/24   Haze Bethann RAMAN, MD     Allergies:    Allergies  Allergen Reactions  . Penicillins Rash  . Sulfa Antibiotics Rash    Social History:   Social  History   Socioeconomic History  . Marital status: Married    Spouse name: Not on file  . Number of children: 5  . Years of education: Not on file  . Highest education level: Not on file  Occupational History  . Not on file  Tobacco Use  . Smoking status: Former    Current packs/day: 0.00    Average packs/day: 1.5 packs/day for 45.0 years (67.5 ttl pk-yrs)    Types: Cigarettes    Start date: 81    Quit date: 2016    Years since quitting: 9.7  . Smokeless tobacco: Former  Advertising account planner  . Vaping status: Former  . Start date: 02/08/2015  . Quit date: 11/30/2020  Substance and Sexual Activity  . Alcohol use: Not Currently    Alcohol/week: 12.0 standard drinks of alcohol    Types: 12 Cans of beer per week    Comment: Quit in 2012  . Drug use: Never  . Sexual activity: Not on file  Other Topics Concern  . Not on file  Social History Narrative  . Not on file   Social Drivers of Health   Financial Resource Strain: Not on file  Food Insecurity: Low Risk  (03/31/2024)   Received from [facility]   Hunger Vital Sign   . Within the past 12 months, you worried that your food would run out before you got money to buy more: Never true   . Within the past 12 months, the food you bought just didn't last and you didn't have money to get more. : Never true  Transportation Needs: No Transportation Needs (03/31/2024)   Received from [facility]   . In the past 12 months, has lack of reliable transportation kept you from medical appointments, meetings, work or from getting things needed for daily living? : No  Physical Activity: Not on file  Stress: Not on file  Social Connections: Not on file  Intimate Partner Violence: Not on file     Family History:   The patient's family history includes CAD in his father; Congestive Heart Failure in his father; Heart disease in his brother. There is no history of Colon cancer, Colon polyps, Esophageal cancer, Rectal cancer, or  Stomach cancer.    ROS:  Please see the history of present illness.  All other ROS reviewed and negative.     Physical Exam/Data: Vitals:   08/11/24 1301 08/11/24 1306 08/11/24 1311 08/11/24 1322  BP: (!) 145/86 (!) 156/86 (!) 156/86 (!) 152/90  Pulse: 65 66 66 64  Resp: 19 (!) 25  17  Temp:    98.2 F (36.8 C)  TempSrc:    Temporal  SpO2: 95% 94%  94%  Weight:      Height:  No intake or output data in the 24 hours ending 08/11/24 1427    08/11/2024   10:06 AM 07/28/2024    4:10 PM 06/14/2024   11:18 AM  Last 3 Weights  Weight (lbs) 268 lb 269 lb 3.2 oz 259 lb 8 oz  Weight (kg) 121.564 kg 122.108 kg 117.708 kg     Body mass index is 36.35 kg/m.  General:  Pleasant older male, sitting up in bed, mildly dyspneic at rest on [place]@2L  HEENT: normal Neck: + JVD to ear Vascular: No carotid bruits; Distal pulses 2+ bilaterally   Cardiac:  normal S1, S2; RRR; no murmur  Lungs: diminished in bases with expiratory wheezing  Abd: soft, nontender, no hepatomegaly  Ext: no edema Musculoskeletal:  No deformities, BUE and BLE strength normal and equal Skin: warm and dry  Neuro: no focal abnormalities noted Psych:  Normal affect   EKG:  Not preformed today   Relevant CV Studies:  Echo: 07/21/2024  IMPRESSIONS     1. Apical hypokinesis. Left ventricular ejection fraction, by estimation,  is 30 to 35%. The left ventricle has moderately decreased function. The  left ventricle demonstrates global hypokinesis. There is mild left  ventricular hypertrophy. Left  ventricular diastolic parameters are consistent with Grade I diastolic  dysfunction (impaired relaxation).   2. Right ventricular systolic function is mildly reduced. The right  ventricular size is normal.   3. The mitral valve is normal in structure. No evidence of mitral valve  regurgitation. No evidence of mitral stenosis.   4. The aortic valve is normal in structure. Aortic valve regurgitation is  mild. No aortic  stenosis is present.   5. Aortic dilatation noted. There is mild dilatation of the ascending  aorta, measuring 42 mm.   6. The inferior vena cava is normal in size with greater than 50%  respiratory variability, suggesting right atrial pressure of 3 mmHg.   FINDINGS   Left Ventricle: Apical hypokinesis. Left ventricular ejection fraction,  by estimation, is 30 to 35%. The left ventricle has moderately decreased  function. The left ventricle demonstrates global hypokinesis. Definity   contrast agent was given IV to  delineate the left ventricular endocardial borders. The left ventricular  internal cavity size was normal in size. There is mild left ventricular  hypertrophy. Left ventricular diastolic parameters are consistent with  Grade I diastolic dysfunction  (impaired relaxation).   Right Ventricle: The right ventricular size is normal. No increase in  right ventricular wall thickness. Right ventricular systolic function is  mildly reduced.   Left [facility]: Left atrial size was normal in size.   Right [facility]: Right atrial size was normal in size.   Pericardium: There is no evidence of pericardial effusion.   Mitral Valve: The mitral valve is normal in structure. Mild mitral annular  calcification. No evidence of mitral valve regurgitation. No evidence of  mitral valve stenosis.   Tricuspid Valve: The tricuspid valve is normal in structure. Tricuspid  valve regurgitation is not demonstrated. No evidence of tricuspid  stenosis.   Aortic Valve: The aortic valve is normal in structure. Aortic valve  regurgitation is mild. No aortic stenosis is present.   Pulmonic Valve: The pulmonic valve was normal in structure. Pulmonic valve  regurgitation is not visualized. No evidence of pulmonic stenosis.   Aorta: Aortic dilatation noted. There is mild dilatation of the ascending  aorta, measuring 42 mm.   Venous: The inferior vena cava is normal in size with greater than 50%   respiratory  variability, suggesting right atrial pressure of 3 mmHg.   IAS/Shunts: No atrial level shunt detected by color flow Doppler.   Coronary angiography 08/11/2024: LM: Moderately calcified distal 50% stenosis LAD: Proximal diffuse 50 to 60% severe calcific disease, followed by complete occlusion.          Faint collaterals filling septal vessels from the circumflex.  No clear target noted in LAD for either surgical or percutaneous revascularization. Lcx: Large, codominant vessel.         Ostial/proximal mildly calcific 75% stenosis at bifurcation with large OM1 vessel with patent stent and 60% ostial in-stent restenosis         Distal circumflex focal 90% stenosis           RCA: Codominant vessel.  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   Recommendation: Complex intubation in a patient with several medical comorbidities, including CKD stage IIIb, possible interstitial lung disease, HFrEF, multivessel CAD. Primary complaint has been exertional dyspnea, leg edema, as well as occasional chest discomfort, physical activity is limited due to dyspnea. Of note, I was unable to deliver balloons across proximal RCA, and was unable to deliver stent across proximal left circumflex-presumably due to dissection -back in 2022.  Patient did fairly well for last 3 years, until recent dyspnea symptoms and new drop in LVEF. He needs optimization from cardiac as well as pulmonary standpoint before consideration for revascularization. Will admit for IV diuresis and expediting heart failure management. Will discuss  in multidisciplinary heart team meeting regarding revascularization options.   Newman JINNY Lawrence, MD  Laboratory Data: High Sensitivity Troponin:  No results for input(s): TROPONINIHS in the last 720 hours.    ChemistryNo results for input(s): NA, K, CL, CO2, GLUCOSE, BUN, CREATININE, CALCIUM , MG, GFRNONAA, GFRAA, ANIONGAP in the last 168 hours.  No results for input(s): PROT, ALBUMIN, AST, ALT, ALKPHOS, BILITOT in the last 168 hours. Lipids No results for input(s): CHOL, TRIG, HDL, LABVLDL, LDLCALC, CHOLHDL in the last 168 hours. HematologyNo results for input(s): WBC, RBC, HGB, HCT, MCV, MCH, MCHC, RDW, PLT in the last 168 hours. Thyroid No results for input(s): TSH, FREET4 in the last 168 hours. BNPNo results for input(s): BNP, PROBNP in the last 168 hours.  DDimer No results for input(s): DDIMER in the last 168 hours.  Radiology/Studies:  CARDIAC CATHETERIZATION Result Date: 08/11/2024 Images from the original result were not included. SABRA Fraise LM to Ost LAD lesion is 50% stenosed. .  Prox LAD to Mid LAD lesion is 100% stenosed. .  Mid Cx lesion is 40% stenosed. .  Dist Cx lesion is 90% stenosed. SABRA Pais Cx to Prox Cx lesion is 75% stenosed. .  Mid RCA lesion is 40% stenosed. .  Prox RCA lesion is 90% stenosed. .  1st Mrg-2 lesion is 60% stenosed. .  Non-stenotic 1st Mrg-1 lesion was previously treated. .  Non-stenotic 1st Mrg-3 lesion was previously treated. Coronary angiography 08/11/2024: LM: Moderately calcified distal 50% stenosis LAD: Proximal diffuse 50 to 60% severe calcific disease, followed by complete occlusion.  Faint collaterals filling septal vessels from the circumflex.  No clear target noted in LAD for either surgical or percutaneous revascularization. Lcx: Large, codominant vessel.         Ostial/proximal mildly calcific 75% stenosis at bifurcation with large OM1 vessel with patent stent and 60%  ostial in-stent restenosis         Distal circumflex focal 90% stenosis          RCA: Codominant vessel.  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 Recommendation: Complex intubation in a patient with several medical comorbidities, including CKD stage IIIb, possible interstitial lung disease, HFrEF, multivessel CAD. Primary complaint has been exertional dyspnea, leg edema, as well as occasional chest discomfort, physical activity is limited due to dyspnea. Of note, I was unable to deliver balloons across proximal RCA, and was unable to deliver stent across proximal left circumflex-presumably due to dissection -back in 2022.  Patient did fairly well for last 3 years, until recent dyspnea symptoms and new drop in LVEF. He needs optimization from cardiac as well as pulmonary standpoint before consideration for revascularization. Will admit for IV diuresis and expediting heart failure management. Will discuss in multidisciplinary heart team meeting regarding revascularization options. Newman JINNY Lawrence, MD     Assessment and Plan:  Darrell Simpson is a 73 y.o. 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  Acute HFrEF ICM Pulmonary HTN -- Echo 07/21/2024 LVEF of 30 to 35%, apical hypokinesis, mild LVH, grade 1 diastolic dysfunction, mildly reduced RV with no significant valvular disease. -- RHC: RA: 7 mmHg RV: 81/2 mmHg PA: 88/38 mmHg, mPAP 50 mmHg PCW: 17 mmHg, CO 3.9 CI 1.6. He is warm on exam with stable BP  -- GDMT:  previously on coreg  (stopped in the setting of bradycardia), Entresto  (stopped with rise in Cr) -- start IV lasix  80mg  BID, no BB with low CI -- continue farxiga    Multivessel CAD s/p prior PCI to [id number]'77 -- as noted above, cath with multivessel CAD. Of note, previously deemed not a surgical candidate back in 2022.  -- no chest pain currently  -- plans to discuss at multidisciplinary meeting on Friday -- continue ASA, statin   ILD/emphysema on home O2 -- follows with pulmonary at [facility] -- CT chest report 04/2024 with diffuse reticulation in both lungs worse in bases with traction bronchiectasis and honeycombing, mild emphysema  -- 05/11/2024 PFT> Ratio 64% FEV1 1.99L (72%), FVC 3.11 (86%), TLC 4.53 L (72%) DLCO 7.92 (35%)  -- continue Breztri  CKD IIIa -- baseline Cr around 1.4-1.6 -- follow BMET  Risk Assessment/Risk Scores:  [place]  Heart Association (NYHA) Functional Class NYHA Class III/ mixed symptoms with pulmonary HTN   Code Status: Full Code  Severity of Illness: The appropriate patient status for this patient is INPATIENT. Inpatient status is judged to be reasonable and necessary in order to provide the required intensity of service to ensure the patient's safety. The patient's presenting symptoms, physical exam findings, and initial radiographic and laboratory data in the context  of their chronic comorbidities is felt to place them at high risk for further clinical deterioration. Furthermore, it is not anticipated that the patient will be medically stable for discharge from the hospital within 2 midnights of admission.   * I certify that at the point of admission it is my clinical judgment that the patient will require inpatient hospital care spanning beyond 2 midnights from the point of admission due to high intensity of service, high risk for further deterioration and high frequency of surveillance required.*  For questions or updates, please contact [facility] Please consult [email or web address] for contact info under   Signed, Manuelita Rummer, NP  08/11/2024 2:27 PM

## 2024-08-12 NOTE — Progress Notes (Addendum)
 Advanced Heart Failure Rounding Note  Cardiologist: Newman JINNY Lawrence, MD  Chief Complaint: Pulmonary HTN Subjective:    RHC 10/1: RA 7, PA 88/38 (50), PCWP 17, CO/CI 3.9/1.6  Coox 71% CVP 5  Lying in bed, would like to go home but willing to stay another day. Denies shortness of breath. Reports diarrhea.  Objective:    Weight Range: 119.3 kg Body mass index is 35.68 kg/m.   Vital Signs:   Temp:  [97.7 F (36.5 C)-98.5 F (36.9 C)] 98.5 F (36.9 C) (10/02 1113) Pulse Rate:  [62-76] 64 (10/02 1113) Resp:  [15-25] 19 (10/02 1113) BP: (121-161)/(61-119) 128/69 (10/02 1113) SpO2:  [88 %-98 %] 95 % (10/02 1113) Weight:  [119.3 kg-120.5 kg] 119.3 kg (10/02 0548) Last BM Date : 08/10/24  Weight change: Filed Weights   08/11/24 1006 08/11/24 1445 08/12/24 0548  Weight: 121.6 kg 120.5 kg 119.3 kg   Intake/Output:  Intake/Output Summary (Last 24 hours) at 08/12/2024 1216 Last data filed at 08/12/2024 0855 Gross per 24 hour  Intake 1128.79 ml  Output 925 ml  Net 203.79 ml    Physical Exam    General: Well appearing.  Cardiac: S1 and S2 present. No murmurs Abdomen: Soft, non-tender, non-distended.  Extremities: Warm and dry.  No peripheral edema.  Neuro: Alert and oriented x3. Affect pleasant. Moves all extremities without difficulty.  Telemetry   SR 60s, frequent PACs (personally reviewed)  Labs    CBC Recent Labs    08/11/24 1759 08/12/24 0449  WBC 6.6 7.2  HGB 17.1* 15.4  HCT 49.2 43.8  MCV 95.2 93.2  PLT 173 138*   Basic Metabolic Panel Recent Labs    89/98/74 1759 08/12/24 0449 08/12/24 0500  NA 139 137  --   K 3.5 3.0*  --   CL 102 104  --   CO2 26 22  --   GLUCOSE 142* 119*  --   BUN 17 19  --   CREATININE 1.56* 1.75*  --   CALCIUM  8.5* 8.2*  --   MG  --   --  1.9   BNP (last 3 results) Recent Labs    08/11/24 1759  BNP 616.5*   ProBNP (last 3 results) Recent Labs    06/14/24 1251 07/28/24 1652  PROBNP 1,302* 4,553*    Medications:    Scheduled Medications:  aspirin  EC  81 mg Oral Daily   atorvastatin   80 mg Oral Daily   budesonide-glycopyrrolate-formoterol   1 puff Inhalation Daily   Chlorhexidine Gluconate Cloth  6 each Topical Daily   vitamin B-12  500 mcg Oral Daily   dapagliflozin  propanediol  10 mg Oral QAC breakfast   heparin   5,000 Units Subcutaneous Q8H   pantoprazole   40 mg Oral Daily   ranolazine   1,000 mg Oral BID   sildenafil  20 mg Oral TID   sodium chloride  flush  10-40 mL Intracatheter Q12H   sodium chloride  flush  3 mL Intravenous Q12H   sodium chloride  flush  3 mL Intravenous Q12H    Infusions:  sodium chloride      sodium chloride       PRN Medications: sodium chloride , sodium chloride , acetaminophen , HYDROcodone -acetaminophen , ondansetron  (ZOFRAN ) IV, sodium chloride  flush, sodium chloride  flush, sodium chloride  flush  Patient Profile   Darrell Simpson is a 73 y.o. 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 EF 9/25 EF 30-35%, G1DD, mildly reduced RV - RHC 10/1: RA 7, PA 88/38 (50), PCWP 17, CO/CI 3.9/1.6 - co-ox 71; CVP 5 - hold diuresis today - continue Farxiga  10 mg daily - start on spiro 25 mg daily - no ? blocker with low output on cath  mvCAD  - h/o PCI to OM - noted severe on PET cardiac CT 9/25 - LHC 10/1: proxLAD 100% stenosis,  - not previously a surgical candidate in 2022 due to ILD/PAH - continues to  be poor surgical candidate; plan to discuss at cath conference Friday - continue renexa 1g bid - continue ASA and statin  AKI on CKD3b - baseline Cr ~1.3-1.6 - up to 1.75 with contrast load - plan as above  Length of Stay: 1  Darrell Lee, NP  08/12/2024, 12:16 PM  Advanced Heart Failure Team Pager [phone] (M-F; 7a - 5p)  Please contact [facility] for night-coverage after hours (5p -7a ) and weekends on [email or web address]  Patient seen and examined with the above-signed Advanced Practice Provider and/or Housestaff. I personally reviewed laboratory data, imaging studies and relevant notes. I independently examined the patient and formulated the important aspects of the plan. I have edited the note to reflect any of my changes or salient points. I have personally discussed the plan with the patient and/or family.  Diuresed well. Breathing better. Wants PICC out and wants to go home  Co-ox 71% CVP 5 today   General:  Chronically ill appearing. No resp difficulty HEENT: normal Neck: supple. no JVD. Carotids 2+ bilat; no bruits. No lymphadenopathy or thryomegaly appreciated. Cor: PMI nondisplaced. Regular rate & rhythm. No rubs, gallops or murmurs. Lungs: coarse Abdomen: obese soft, nontender, nondistended. No hepatosplenomegaly. No bruits or masses. Good bowel sounds. Extremities: no cyanosis, clubbing, rash, edema Neuro: alert & orientedx3, cranial nerves grossly intact. moves all 4 extremities w/o difficulty. Affect pleasant  Volume status and cardiac output improved.   Have ordered hi-res CT to f/u on ILD. Start sildenafil carefully for PH.   Discussed case with IC team. Will present tomorrow. I suspect LM is quite tight. He is not surgical candidate with comorbidities.   Titrate GDMT as tolerated  Darrell Fronczak, MD  3:00 PM

## 2024-08-12 NOTE — Telephone Encounter (Signed)
 Pharmacy Patient Advocate Encounter  Received notification from [facility] that Prior Authorization for Sildenafil Citrate (PAH) 20MG  tablets  has been APPROVED from 08/12/2024 to 11/10/2025. Ran test claim, Copay is $0.00. This test claim was processed through [facility]- copay amounts may vary at other pharmacies due to pharmacy/plan contracts, or as the patient moves through the different stages of their insurance plan.   PA #/Case ID/Reference #: [id number]

## 2024-08-12 NOTE — Telephone Encounter (Signed)
 Patient Product/process development scientist completed.    The patient is insured through [facility]. Patient has Medicare and is not eligible for a copay card, but may be able to apply for patient assistance or Medicare RX Payment Plan (Patient Must reach out to their plan, if eligible for payment plan), if available.    Ran test claim for sildenafil (Revatio) 20 mg and Requires Prior Authorization   This test claim was processed through [facility]- copay amounts may vary at other pharmacies due to [employer], or as the patient moves through the different stages of their insurance plan.     Reyes Sharps, CPHT Pharmacy Technician III Certified Patient Advocate [facility] Pharmacy Patient Advocate Team Direct Number: [phone]  Fax: [phone]

## 2024-08-13 ENCOUNTER — Ambulatory Visit: Admitting: Cardiology

## 2024-08-13 ENCOUNTER — Other Ambulatory Visit (HOSPITAL_COMMUNITY): Payer: Self-pay

## 2024-08-13 DIAGNOSIS — I5020 Unspecified systolic (congestive) heart failure: Secondary | ICD-10-CM | POA: Diagnosis not present

## 2024-08-13 LAB — BASIC METABOLIC PANEL WITH GFR
Anion gap: 8 (ref 5–15)
BUN: 15 mg/dL (ref 8–23)
CO2: 22 mmol/L (ref 22–32)
Calcium: 8.2 mg/dL — ABNORMAL LOW (ref 8.9–10.3)
Chloride: 108 mmol/L (ref 98–111)
Creatinine, Ser: 1.4 mg/dL — ABNORMAL HIGH (ref 0.61–1.24)
GFR, Estimated: 53 mL/min — ABNORMAL LOW (ref >60)
Glucose, Bld: 90 mg/dL (ref 70–99)
Potassium: 3.7 mmol/L (ref 3.5–5.1)
Sodium: 138 mmol/L (ref 135–145)

## 2024-08-13 LAB — CBC
HCT: 40.6 % (ref 39.0–52.0)
Hemoglobin: 14.3 g/dL (ref 13.0–17.0)
MCH: 32.9 pg (ref 26.0–34.0)
MCHC: 35.2 g/dL (ref 30.0–36.0)
MCV: 93.3 fL (ref 80.0–100.0)
Platelets: 143 K/uL — ABNORMAL LOW (ref 150–400)
RBC: 4.35 MIL/uL (ref 4.22–5.81)
RDW: 13.4 % (ref 11.5–15.5)
WBC: 7 K/uL (ref 4.0–10.5)
nRBC: 0 % (ref 0.0–0.2)

## 2024-08-13 LAB — COOXEMETRY PANEL
Carboxyhemoglobin: 2.5 % — ABNORMAL HIGH (ref 0.5–1.5)
Methemoglobin: <0.7 % (ref 0.0–1.5)
O2 Saturation: 71.3 %
Total hemoglobin: 14.3 g/dL (ref 12.0–16.0)

## 2024-08-13 LAB — LIPOPROTEIN A (LPA): Lipoprotein (a): 48.4 nmol/L — ABNORMAL HIGH (ref <75.0)

## 2024-08-13 MED ORDER — POTASSIUM CHLORIDE CRYS ER 20 MEQ PO TBCR
40.0000 meq | EXTENDED_RELEASE_TABLET | Freq: Every day | ORAL | 0 refills | Status: DC | PRN
  Filled 2024-08-13: qty 30, 15d supply, fill #0
  Filled 2024-08-13: qty 60, 30d supply, fill #0

## 2024-08-13 MED ORDER — SILDENAFIL CITRATE 20 MG PO TABS
20.0000 mg | ORAL_TABLET | Freq: Three times a day (TID) | ORAL | 5 refills | Status: DC
  Filled 2024-08-13: qty 90, 30d supply, fill #0

## 2024-08-13 MED ORDER — SACUBITRIL-VALSARTAN 24-26 MG PO TABS
1.0000 | ORAL_TABLET | Freq: Two times a day (BID) | ORAL | Status: DC
  Administered 2024-08-13: 1 via ORAL
  Filled 2024-08-13: qty 1

## 2024-08-13 MED ORDER — SACUBITRIL-VALSARTAN 24-26 MG PO TABS
1.0000 | ORAL_TABLET | Freq: Two times a day (BID) | ORAL | 5 refills | Status: DC
  Filled 2024-08-13: qty 60, 30d supply, fill #0

## 2024-08-13 MED ORDER — SPIRONOLACTONE 25 MG PO TABS
25.0000 mg | ORAL_TABLET | Freq: Every day | ORAL | 5 refills | Status: DC
  Filled 2024-08-13: qty 30, 30d supply, fill #0

## 2024-08-13 MED ORDER — POTASSIUM CHLORIDE CRYS ER 20 MEQ PO TBCR
40.0000 meq | EXTENDED_RELEASE_TABLET | Freq: Every day | ORAL | Status: DC | PRN
Start: 2024-08-13 — End: 2024-08-13

## 2024-08-13 MED ORDER — DAPAGLIFLOZIN PROPANEDIOL 10 MG PO TABS
10.0000 mg | ORAL_TABLET | Freq: Every day | ORAL | 5 refills | Status: DC
  Filled 2024-08-13: qty 30, 30d supply, fill #0

## 2024-08-13 MED ORDER — FUROSEMIDE 40 MG PO TABS
40.0000 mg | ORAL_TABLET | Freq: Every day | ORAL | 0 refills | Status: DC | PRN
  Filled 2024-08-13: qty 30, 30d supply, fill #0

## 2024-08-13 NOTE — Heart Team MDD (Signed)
   Heart Team Multi-Disciplinary Discussion  Patient: Darrell Simpson  DOB: 01/03/1951  MRN: [id number]   Date: 08/13/2024  3:05 PM    Attendees: Interventional Cardiology: Lurena Red, MD Lonni Hanson, MD Cara Lovelace, MD Alm Clay, MD Gordy Bergamo, MD Newman Lawrence, MD Ozell Fell, MD Deatrice Cage, MD Debby Como, MD    Additional Attendees: Con Clunes, MD Madonna Large, DO   Patient History: 73 year old with interstitial lung disease and multivessel coronary artery disease who presents with worsening shortness of breath and leg edema. He has a history of interstitial lung disease following a COVID-19 infection a couple of years ago. He experiences worsening shortness of breath and leg edema. His ejection fraction has decreased from 50% to 35-40% over the past few years. Some improvement in symptoms was noted with diuresis, but he continues to have shortness of breath and chest tightness. A recent stress test was positive in the right coronary artery and left anterior descending artery territory. The left anterior descending artery is chronically occluded, and there are no targets for intervention. A high-resolution CT scan from this admission shows significant pulmonary issues. His right heart pressures are abnormal with a mean pulmonary artery pressure of 50 mmHg.       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    Review of Prior Angiography and PCI Procedures: PET CT: Ischemia in distal LAD and RCA  Right heart catheterization: Mean PA pressure 50 mmHg, EDP 26 mmHg Coronary angiography 08/11/2024: LM: Moderately calcified distal 50% stenosis (at least 50% stenosis, accurate severity assessment limited due to calcification, will need IVUS) LAD: Proximal diffuse 50 to 60% severe calcific disease, followed by complete occlusion.          Faint collaterals filling septal vessels from the circumflex.  No clear target noted in LAD for either surgical or percutaneous revascularization. LCx: Large, codominant vessel.         Ostial/proximal mildly calcific 75% stenosis at bifurcation with large OM1 vessel with patent stent and 60% ostial in-stent restenosis         Distal circumflex focal 90% stenosis           RCA: Codominant vessel.  Proximal severely calcific 90% stenosis   LVEDP 26 mmHg    Discussion: Assessment and Plan Coronary artery disease with prior PCI and chronic total occlusion of LAD Multivessel coronary artery disease with prior PCI to LCx and RCA. Chronic total occlusion of LAD. Distal left main and LCx lesions are significant but not much different from three years ago. Previous PCI attempts were challenging due to calcification and anatomical difficulties. Current symptoms may not be significantly improved by PCI given the complexity and pulmonary hypertension. PET CT showed ischemia in distal LAD and RCA, but LAD is occluded. -Medical Therapy and defer PCI at this time due to high pulmonary pressures and uncertain benefit.     Recommendations: Medical Therapy     Shona Palma, RN  08/13/2024 3:05 PM

## 2024-08-13 NOTE — Evaluation (Signed)
 Occupational Therapy Evaluation Patient Details Name: Darrell Simpson MRN: [id number] DOB: 1951-02-17 Today's Date: 08/13/2024   History of Present Illness   Darrell Simpson is a 73 y.o. male admitted 10/1 with CHF.  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 Pt currently with functional limitiations due to the deficits listed below (see OT problem list). Pt at baseline requires 2L [place] 02. Pt with poor return demo of energy conservation and use of [place] 2L. Pt fatigues and needs to have extended time to rebound from activity. Pt expressed need for electronic scooter at home and trying to qualify. Sharing information in this note in case provider can help update medical form for qualifications. Recommending rollator for in home use.  Pt will benefit from skilled OT to increase their independence and safety with adls and balance to allow discharge home.      If plan is discharge home, recommend the following:         Functional Status Assessment   Patient has had a recent decline in their functional status and demonstrates the ability to make significant improvements in function in a reasonable and predictable amount of time.     Equipment Recommendations   Other (comment) (rollator)     Recommendations for Other Services         Precautions/Restrictions   Precautions Precautions: Fall     Mobility Bed Mobility Overal bed mobility: Independent                  Transfers Overall transfer level: Independent                        Balance                                           ADL either performed or assessed with clinical judgement   ADL Overall ADL's : Needs assistance/impaired Eating/Feeding: Independent   Grooming: Wash/dry face;Set up;Sitting Grooming Details (indicate cue type and reason): noted to have  oxygen  off, declined to need it. DOE 2 out 4     Lower Body Bathing: Contact guard assist;Sit to/from stand Lower Body Bathing Details (indicate cue type and reason): declined to use soap stating i dont want that soap to stay on my skin. pt with multiple wash clothes offered. pt washing thighs only and changing underwear     Lower Body Dressing: Contact guard assist;Sit to/from stand         Toileting - Clothing Manipulation Details (indicate cue type and reason): pt noted to have spilled urinal but not telling staff. pt becoming upset stating he wants to be abl eto get up to the bathroom. RN made aware. 300CC in urinal with dark color     Functional mobility during ADLs: Contact guard assist General ADL Comments: pt ambulating ~5 ft from sink to chair with DOE 3 out 4 with O2 83% RA. pt with )2 provided and encouraged pursed lip breathing. energy conservation education provided and handout for reference. pt reading handout with glasses during discussion     Vision Baseline Vision/History: 1 Wears glasses Patient Visual Report: No change from baseline       Perception         Praxis  Pertinent Vitals/Pain Pain Assessment Pain Assessment: No/denies pain     Extremity/Trunk Assessment Upper Extremity Assessment Upper Extremity Assessment: Overall WFL for tasks assessed   Lower Extremity Assessment Lower Extremity Assessment: Generalized weakness   Cervical / Trunk Assessment Cervical / Trunk Assessment: Normal   Communication Communication Communication: No apparent difficulties   Cognition Arousal: Alert Behavior During Therapy: WFL for tasks assessed/performed Cognition: No family/caregiver present to determine baseline             OT - Cognition Comments: pt anxious to leave and reports they arent doing anything for me but making me lay in this bed. i am ready to go home Pt answering all questions appropriately. pt does not wear 02 consistently. pt  reports i wear it sometimes                 Following commands: Intact       Cueing  General Comments      90% RA on arrival supine in the bed with O2 decrease with talking 88%. pt declines 02 2L to transfer to sink surface.   Exercises     Shoulder Instructions      Home Living Family/patient expects to be discharged to:: Private residence Living Arrangements: Spouse/significant other Available Help at Discharge: Family;Available 24 hours/day Type of Home: House Home Access: Stairs to enter [employer] of Steps: 2 Entrance Stairs-Rails: None Home Layout: One level     Bathroom Shower/Tub: Tub/shower unit;Walk-in shower   Bathroom Toilet: Standard     Home Equipment: Cane - single point;Shower seat (Home O2 2L prn, pulse oximeter)   Additional Comments: wife and pt watch grandchildren 1 at a time 2 days per week 1 yr and 73 yo      Prior Functioning/Environment Prior Level of Function : Independent/Modified Independent             Mobility Comments: reports he does not have a scooter but has to borrow one when out in the community. trying to get qualified ADLs Comments: I B/D    OT Problem List: Decreased activity tolerance;Decreased safety awareness;Decreased knowledge of use of DME or AE;Decreased knowledge of precautions;Cardiopulmonary status limiting activity;Obesity   OT Treatment/Interventions: Self-care/ADL training;Energy conservation;DME and/or AE instruction;Therapeutic activities;Balance training;Patient/family education      OT Goals(Current goals can be found in the care plan section)   Acute Rehab OT Goals Patient Stated Goal: to go home today OT Goal Formulation: With patient Time For Goal Achievement: 08/27/24 Potential to Achieve Goals: Good   OT Frequency:  Min 2X/week    Co-evaluation              AM-PAC OT 6 Clicks Daily Activity     Outcome Measure Help from another person eating meals?: None Help  from another person taking care of personal grooming?: None Help from another person toileting, which includes using toliet, bedpan, or urinal?: A Little Help from another person bathing (including washing, rinsing, drying)?: A Little Help from another person to put on and taking off regular upper body clothing?: None Help from another person to put on and taking off regular lower body clothing?: A Little 6 Click Score: 21   End of Session Equipment Utilized During Treatment: Oxygen  Nurse Communication: Mobility status;Precautions  Activity Tolerance: Patient limited by fatigue Patient left: in chair;with call bell/phone within reach;with nursing/sitter in room  OT Visit Diagnosis: Unsteadiness on feet (R26.81);Muscle weakness (generalized) (M62.81)  Time: 9150-9083 OT Time Calculation (min): 27 min Charges:  OT General Charges $OT Visit: 1 Visit OT Evaluation $OT Eval Moderate Complexity: 1 Mod   Brynn, OTR/L  Acute Rehabilitation Services Office: [phone] .   Ely Molt 08/13/2024, 9:32 AM

## 2024-08-13 NOTE — TOC Initial Note (Signed)
 Transition of Care [facility]) - Initial/Assessment Note    Patient Details  Name: Darrell Simpson MRN: [id number] Date of Birth: [birth year]  Transition of [facility]) CM/SW Contact:    Justina Delcia Czar, RN Phone Number: [phone] 08/13/2024, 3:22 PM  Clinical Narrative:                 Spoke to pt and offered choice for [facility]. Pt agreeable to [facility]. Contacted [facility] rep, Artavia with new referral. Pt states wife will provide transportation home. Has oxygen  and portable oxygen  at home.   Contacted [facility] for [employer] for home.   Unable to arrange PCP appt. Office closed. Faxed dc summary to office requesting hospital follow up appt and contact pt with time.   Pt has scale at home for daily weights. Reviewed with pt low sodium and heart healthy diet.   Expected Discharge Plan: Home w Home Health Services Barriers to Discharge: No Barriers Identified   Patient Goals and CMS Choice Patient states their goals for this hospitalization and ongoing recovery are:: wants to recover CMS Medicare.gov Compare Post Acute Care list provided to:: Patient Choice offered to / list presented to : Patient      Expected Discharge Plan and Services   Discharge Planning Services: CM Consult Post Acute Care Choice: Home Health Living arrangements for the past 2 months: Single Family Home Expected Discharge Date: 08/13/24               DME Arranged: Vannie rolling with seat DME Agency: [facility] Date DME Agency Contacted: 08/13/24 Time DME Agency Contacted: [phone] Representative spoke with at DME Agency: [facility] HH Arranged: PT, OT [facility] Agency: [facility] Date HH Agency Contacted: 08/13/24 Time HH Agency Contacted: 1521 Representative spoke with at [facility] Agency: [facility]  Prior Living Arrangements/Services Living arrangements for the past 2 months: Single Family Home Lives with:: Spouse Patient language and need for interpreter reviewed:: Yes Do you feel  safe going back to the place where you live?: Yes      Need for Family Participation in Patient Care: No (Comment) Care giver support system in place?: Yes (comment) Current home services: DME (cane, oxygen , scale) Criminal Activity/Legal Involvement Pertinent to Current Situation/Hospitalization: No - Comment as needed  Activities of Daily Living   ADL Screening (condition at time of admission) Independently performs ADLs?: Yes (appropriate for developmental age) Is the patient deaf or have difficulty hearing?: No Does the patient have difficulty seeing, even when wearing glasses/contacts?: No Does the patient have difficulty concentrating, remembering, or making decisions?: No  Permission Sought/Granted Permission sought to share information with : Case Manager, Family Supports, PCP Permission granted to share information with : Yes, Verbal Permission Granted  Share Information with NAME: Bobbie Valletta  Permission granted to share info w AGENCY: Home Health, PCP, DME  Permission granted to share info w Relationship: wife  Permission granted to share info w Contact Information: [phone]  Emotional Assessment Appearance:: Appears stated age Attitude/Demeanor/Rapport: Engaged Affect (typically observed): Accepting Orientation: : Oriented to Self, Oriented to Place, Oriented to  Time, Oriented to Situation   Psych Involvement: No (comment)  Admission diagnosis:  HFrEF (heart failure with reduced ejection fraction) (HCC) [I50.20] CHF (congestive heart failure) (HCC) [I50.9] Patient Active Problem List   Diagnosis Date Noted   CHF (congestive heart failure) (HCC) 08/11/2024   Bradycardia 06/14/2024   Exertional dyspnea 06/14/2024   Leg edema 06/14/2024   Mixed hyperlipidemia 09/20/2021   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 [place] [address] Phone: [phone] Fax: [phone]  [id number] - [place], [place] - [address] AT [facility] OF [address] [place] [address] Phone: [phone] Fax: [phone]  [facility] - [place], [place] - [address] [place] [address] Phone: [phone] Fax: [phone]     Social Drivers of Health (SDOH) Social History: SDOH Screenings   Food Insecurity: No Food Insecurity (08/12/2024)  Housing: Low Risk  (08/12/2024)  Transportation Needs: No Transportation Needs (08/12/2024)  Utilities: Not At Risk (08/12/2024)  Social Connections: Moderately Integrated (08/12/2024)  Tobacco Use: Medium Risk (08/12/2024)   SDOH Interventions:     Readmission Risk Interventions     No data to display

## 2024-08-13 NOTE — Progress Notes (Signed)
 PICC removed per protocol per MD order. Manual pressure applied for 5 mins. Vaseline gauze, gauze, and Tegaderm applied over insertion site. No bleeding or swelling noted. Instructed patient to remain in bed for thirty mins. Educated patient about S/S of infection and when to call MD; no heavy lifting or pressure on R side for 24 hours; keep dressing dry and intact for 24 hours. Pt verbalized comprehension.

## 2024-08-14 ENCOUNTER — Encounter: Payer: Self-pay | Admitting: Cardiology

## 2024-08-16 NOTE — Telephone Encounter (Signed)
 Is this something you wanted him to continue or stop?

## 2024-08-16 NOTE — Telephone Encounter (Signed)
 He should absolutely continue oxygen . Also, he should have appt w/his pulmonologist Dr. McQuaid soon.  Thanks MJP

## 2024-08-17 ENCOUNTER — Other Ambulatory Visit (HOSPITAL_COMMUNITY)

## 2024-08-18 ENCOUNTER — Other Ambulatory Visit: Payer: Self-pay | Admitting: Cardiology

## 2024-08-26 ENCOUNTER — Telehealth (HOSPITAL_COMMUNITY): Payer: Self-pay

## 2024-08-26 NOTE — Telephone Encounter (Signed)
 Called to confirm/remind patient of their appointment at [facility] on 08/27/24.   Appointment:   [x] Confirmed  [] Left mess   [] No answer/No voice mail  [] VM Full/unable to leave message  [] Phone not in service  Patient reminded to bring all medications and/or complete list.  Confirmed patient has transportation. Gave directions, instructed to utilize valet parking.

## 2024-08-27 ENCOUNTER — Encounter (HOSPITAL_COMMUNITY): Payer: Self-pay

## 2024-08-27 ENCOUNTER — Ambulatory Visit (HOSPITAL_COMMUNITY)
Admit: 2024-08-27 | Discharge: 2024-08-27 | Disposition: A | Discharge status: Home / Self Care | Source: Ambulatory Visit | Attending: Family Medicine | Admitting: Family Medicine

## 2024-08-27 VITALS — BP 122/80 | HR 81 | Ht 72.0 in | Wt 272.0 lb

## 2024-08-27 DIAGNOSIS — I25118 Atherosclerotic heart disease of native coronary artery with other forms of angina pectoris: Secondary | ICD-10-CM

## 2024-08-27 DIAGNOSIS — I5022 Chronic systolic (congestive) heart failure: Secondary | ICD-10-CM | POA: Insufficient documentation

## 2024-08-27 DIAGNOSIS — Z7982 Long term (current) use of aspirin: Secondary | ICD-10-CM | POA: Diagnosis not present

## 2024-08-27 DIAGNOSIS — Z955 Presence of coronary angioplasty implant and graft: Secondary | ICD-10-CM | POA: Diagnosis not present

## 2024-08-27 DIAGNOSIS — J84112 Idiopathic pulmonary fibrosis: Secondary | ICD-10-CM | POA: Insufficient documentation

## 2024-08-27 DIAGNOSIS — J439 Emphysema, unspecified: Secondary | ICD-10-CM | POA: Diagnosis not present

## 2024-08-27 DIAGNOSIS — R590 Localized enlarged lymph nodes: Secondary | ICD-10-CM | POA: Insufficient documentation

## 2024-08-27 DIAGNOSIS — I2510 Atherosclerotic heart disease of native coronary artery without angina pectoris: Secondary | ICD-10-CM | POA: Diagnosis not present

## 2024-08-27 DIAGNOSIS — R002 Palpitations: Secondary | ICD-10-CM | POA: Insufficient documentation

## 2024-08-27 DIAGNOSIS — I272 Pulmonary hypertension, unspecified: Secondary | ICD-10-CM | POA: Diagnosis not present

## 2024-08-27 DIAGNOSIS — Z79899 Other long term (current) drug therapy: Secondary | ICD-10-CM | POA: Diagnosis not present

## 2024-08-27 DIAGNOSIS — N1832 Chronic kidney disease, stage 3b: Secondary | ICD-10-CM | POA: Diagnosis not present

## 2024-08-27 DIAGNOSIS — Z87891 Personal history of nicotine dependence: Secondary | ICD-10-CM | POA: Diagnosis not present

## 2024-08-27 DIAGNOSIS — N183 Chronic kidney disease, stage 3 unspecified: Secondary | ICD-10-CM

## 2024-08-27 DIAGNOSIS — I130 Hypertensive heart and chronic kidney disease with heart failure and stage 1 through stage 4 chronic kidney disease, or unspecified chronic kidney disease: Secondary | ICD-10-CM | POA: Insufficient documentation

## 2024-08-27 DIAGNOSIS — R0981 Nasal congestion: Secondary | ICD-10-CM | POA: Diagnosis not present

## 2024-08-27 DIAGNOSIS — I5020 Unspecified systolic (congestive) heart failure: Secondary | ICD-10-CM

## 2024-08-27 DIAGNOSIS — Z9981 Dependence on supplemental oxygen: Secondary | ICD-10-CM | POA: Diagnosis not present

## 2024-08-27 DIAGNOSIS — R0989 Other specified symptoms and signs involving the circulatory and respiratory systems: Secondary | ICD-10-CM | POA: Diagnosis not present

## 2024-08-27 LAB — BASIC METABOLIC PANEL WITH GFR
Anion gap: 13 (ref 5–15)
BUN: 14 mg/dL (ref 8–23)
CO2: 20 mmol/L — ABNORMAL LOW (ref 22–32)
Calcium: 9.7 mg/dL (ref 8.9–10.3)
Chloride: 106 mmol/L (ref 98–111)
Creatinine, Ser: 1.73 mg/dL — ABNORMAL HIGH (ref 0.61–1.24)
GFR, Estimated: 41 mL/min — ABNORMAL LOW (ref >60)
Glucose, Bld: 97 mg/dL (ref 70–99)
Potassium: 4.9 mmol/L (ref 3.5–5.1)
Sodium: 139 mmol/L (ref 135–145)

## 2024-08-27 LAB — BRAIN NATRIURETIC PEPTIDE: B Natriuretic Peptide: 273.4 pg/mL — ABNORMAL HIGH (ref 0.0–100.0)

## 2024-08-27 MED ORDER — POTASSIUM CHLORIDE CRYS ER 20 MEQ PO TBCR: 20.0000 meq | EXTENDED_RELEASE_TABLET | ORAL | 1 refills | Status: DC

## 2024-08-27 MED ORDER — FUROSEMIDE 40 MG PO TABS: 40.0000 mg | ORAL_TABLET | ORAL | 1 refills | Status: DC

## 2024-08-27 NOTE — Progress Notes (Signed)
 ADVANCED HF CLINIC CONSULT NOTE   Primary Care: Norval Kettle, MD Primary Cardiologist: Newman JINNY Lawrence, MD HF Cardiologist: Dr. Cherrie  HPI: Mr. Darrell Simpson is a 73 y.o. male with past medical history of CAD s/p PCI to OM1 '22, CKD stage IIIa, HFrEF, interstitial lung disease/emphysema on home O2 2 L chronically.  He has been followed by Dr. Lawrence.  Initially seen back in 2022 and underwent heart catheterization found to have multivessel CAD including occluded proximal LAD.  He was evaluated by CT surgery but deemed not to be an appropriate surgical candidate secondary to his lung disease.  He had unsuccessful attempt at PCI to RCA but did undergo successful orbital atherectomy PTCA and stenting to OM1.  Attempts were made to intervene on the circumflex but unable to advance stent which led to nonflow limiting dissection in the proximal circumflex.   Followed by pulmonology, Dr. Alaine at [facility] for history of interstitial lung disease as well as emphysema. He is maintained on home O2 at 2 L, as well as CPAP.   Underwent cardiac PET 07/2024 with severe partially reversible perfusion defect in the apical to mid anterior/anterior septal wall consistent with infarct, peri-infarct ischemia.  Study overall suggestive of severe multivessel CAD and considered high risk with recommendations for cardiac catheterization.  Echo showed EF 30-35%, apical hypokinesis, mild LVH, grade 1 diastolic dysfunction, mildly reduced RV with no significant valvular disease.   Arranged R/LHC by Dr. Lawrence 10/25 due to worsening exertional symptoms. 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 AHF consulted, diuresed and added sildenafil 20 mg tid. Felt not to be a candidate for CABG, IC team decided to manage medically. Hi-Res CT showed evidence of UIP, emphysematous changes, mediastinal lymphadenopathy. GDMT titrated and he was discharged home,  weight 262 lbs.  Today he returns for post hospital HF follow up. Overall feeling fair. Breathing not too good having chest congestion and nasal congestion. Wears 2L oxygen  at home, no oxygen  today. He has SOB walking > 30 feet. He feels palpitations, more so at night. Denies abnormal bleeding, CP, dizziness, edema, or PND/Orthopnea. Appetite ok. Weight at home 175 pounds. Taking all medications. Does not have CPAP yet. Smokes 1/2 ppd. Took Lasix  1x since discharge. Wants to stop his statin, read that they cause muscle weakness.  Cardiac Studies - R/LHC 10/25: severe mvCAD; RA 7, PA 88/38 (50), PCWP 17, CO/CI 3.9/1.6  - Cardiac PET 9/25: severe partially reversible perfusion defect in the apical to mid anterior/anterior septal wall consistent with infarct, peri-infarct ischemia.  Study overall suggestive of severe multivessel CAD and considered high risk with recommendations for cardiac catheterization.  - Echo (9/25): EF 30-35%   Past Medical History:  Diagnosis Date   Arthritis    psioriatic arthritis    Cancer (HCC)    squamous cell skin cancer right cheek    CHF (congestive heart failure) (HCC)    Chronic kidney disease    back to 80% func- improving per pt    Diverticulosis    GERD (gastroesophageal reflux disease)    Hypertension    Current Outpatient Medications  Medication Sig Dispense Refill   albuterol  (VENTOLIN  HFA) 108 (90 Base) MCG/ACT inhaler Inhale 2 puffs into the lungs every 4 (four) hours as needed for wheezing or shortness of breath.     aspirin  81 MG EC tablet Take 1 tablet (81 mg total) by mouth daily. Swallow whole. 30 tablet 11   atorvastatin  (LIPITOR )  80 MG tablet TAKE 1 TABLET BY MOUTH ONCE DAILY 90 tablet 3   budesonide-glycopyrrolate-formoterol  (BREZTRI AEROSPHERE) 160-9-4.8 MCG/ACT AERO inhaler Inhale 1 puff into the lungs daily.     Cyanocobalamin (VITAMIN B 12) 500 MCG TABS Take by mouth as needed. Pt takes 1-2 x a month (Patient taking differently: Take  500 mcg by mouth daily.)     dapagliflozin  propanediol (FARXIGA ) 10 MG TABS tablet Take 1 tablet (10 mg total) by mouth daily before breakfast. 30 tablet 5   furosemide  (LASIX ) 40 MG tablet Take 1 tablet (40 mg total) by mouth daily as needed. 30 tablet 0   HYDROcodone -acetaminophen  (NORCO) 10-325 MG tablet Take 1 tablet by mouth every 4 (four) hours as needed for moderate pain.     ipratropium-albuterol  (DUONEB) 0.5-2.5 (3) MG/3ML SOLN Take 3 mLs by nebulization 4 (four) times daily. (Patient taking differently: Take 3 mLs by nebulization every 4 (four) hours as needed (emphysema).) 360 mL 1   pantoprazole  (PROTONIX ) 40 MG tablet TAKE ONE TABLET BY MOUTH DAILY 90 tablet 3   potassium chloride SA (KLOR-CON M) 20 MEQ tablet Take 2 tablets (40 mEq total) by mouth daily as needed. When taking PRN lasix  60 tablet 0   ranolazine  (RANEXA ) 1000 MG SR tablet TAKE 1 TABLET BY MOUTH TWICE DAILY 180 tablet 3   sacubitril -valsartan  (ENTRESTO ) 24-26 MG Take 1 tablet by mouth 2 (two) times daily. 60 tablet 5   sildenafil (REVATIO) 20 MG tablet Take 1 tablet (20 mg total) by mouth 3 (three) times daily. 90 tablet 5   spironolactone (ALDACTONE) 25 MG tablet Take 1 tablet (25 mg total) by mouth daily. 30 tablet 5   SYMBICORT 160-4.5 MCG/ACT inhaler Inhale 2 puffs into the lungs. (Patient taking differently: Inhale 2 puffs into the lungs daily.)     Vitamin D, Ergocalciferol, (DRISDOL) 1.25 MG (50000 UNIT) CAPS capsule Take 50,000 Units by mouth once a week.     No current facility-administered medications for this encounter.   Allergies  Allergen Reactions   Penicillins Rash   Sulfa Antibiotics Rash   Social History   Socioeconomic History   Marital status: Married    Spouse name: Not on file   Number of children: 5   Years of education: Not on file   Highest education level: Not on file  Occupational History   Not on file  Tobacco Use   Smoking status: Former    Current packs/day: 0.00    Average  packs/day: 1.5 packs/day for 45.0 years (67.5 ttl pk-yrs)    Types: Cigarettes    Start date: 21    Quit date: 2016    Years since quitting: 9.8   Smokeless tobacco: Former  Advertising account planner   Vaping status: Former   Start date: 02/08/2015   Quit date: 11/30/2020  Substance and Sexual Activity   Alcohol use: Not Currently    Alcohol/week: 12.0 standard drinks of alcohol    Types: 12 Cans of beer per week    Comment: Quit in 2012   Drug use: Never   Sexual activity: Not on file  Other Topics Concern   Not on file  Social History Narrative   Not on file   Social Drivers of Health   Financial Resource Strain: Not on file  Food Insecurity: No Food Insecurity (08/12/2024)   Hunger Vital Sign    Worried About Running Out of Food in the Last Year: Never true    Ran Out of Food in the Last  Year: Never true  Transportation Needs: No Transportation Needs (08/12/2024)   PRAPARE - Administrator, Civil Service (Medical): No    Lack of Transportation (Non-Medical): No  Physical Activity: Not on file  Stress: Not on file  Social Connections: Moderately Integrated (08/12/2024)   Social Connection and Isolation Panel    Frequency of Communication with Friends and Family: Once a week    Frequency of Social Gatherings with Friends and Family: Once a week    Attends Religious Services: 1 to 4 times per year    Active Member of [employer] or Organizations: Yes    Attends Engineer, structural: More than 4 times per year    Marital Status: Married  Catering manager Violence: Not At Risk (08/12/2024)   Humiliation, Afraid, Rape, and Kick questionnaire    Fear of Current or Ex-Partner: No    Emotionally Abused: No    Physically Abused: No    Sexually Abused: No   Family History  Problem Relation Age of Onset   CAD Father    Congestive Heart Failure Father    Heart disease Brother    Colon cancer Neg Hx    Colon polyps Neg Hx    Esophageal cancer Neg Hx    Rectal cancer Neg Hx     Stomach cancer Neg Hx    Wt Readings from Last 3 Encounters:  08/27/24 123.4 kg (272 lb)  08/13/24 120.6 kg (265 lb 14 oz)  07/28/24 122.1 kg (269 lb 3.2 oz)   BP 122/80   Pulse 81   Ht 6' (1.829 m)   Wt 123.4 kg (272 lb)   SpO2 94%   BMI 36.89 kg/m   PHYSICAL EXAM: General:  NAD. No resp difficulty, arrived in [facility], chronically-ill appearing HEENT: Normal Neck: Supple. Thick neck but looks mildly elevated Cor: Regular rate & rhythm. No rubs, gallops or murmurs. Lungs: Coarse Abdomen: Soft, nontender, nondistended.  Extremities: No cyanosis, clubbing, rash, edema Neuro: Alert & oriented x 3, moves all 4 extremities w/o difficulty. Affect pleasant.  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 Chronic HFrEF - ICM.  - Echo (9/25): EF 30-35%, G1DD, mildly reduced RV - RHC (10/25): RA 7, PA 88/38 (50), PCWP 17, CO/CI 3.9/1.6 - NYHA IIIb, suspect lung disease main driver of symptoms. Volume up a bit, weigh up - Change Lasix  to 40 mg MW, take 20 KCL on MF - Continue Farxiga  10 mg daily - Continue spiro 25 mg daily - Continue Entresto  24/26 mg bid - no ? blocker with low output on cath - Labs today   3. CAD  - h/o PCI to OM - noted severe on PET cardiac CT 9/25 - LHC 10/1: proxLAD 100% stenosis,  - not previously a surgical candidate in 2022 due to ILD/PAH - continues to be poor surgical candidate; plan to discuss at cath conference Friday - No chest pain - Continue Ranexa  1g bid -  Continue ASA and statin - he declines  referral to Lipid Clinic. Recommend he continue his statin   4. CKD 3b - Baseline Cr ~1.3-1.6 - Continue SGLT2i - Labs today  Follow up in 2 months with Dr. Cherrie. Discuss timing of repeat RHC.  Harlene Gainer, FNP-BC 08/27/24

## 2024-08-27 NOTE — Patient Instructions (Addendum)
 Medication Changes:  TAKE LASIX  (FUROSEMIDE ) 40MG  ON MONDAY AND FRIDAY'S WITH OF POTASSIUM   Lab Work:  Labs done today, your results will be available in MyChart, we will contact you for abnormal readings.  Follow-Up in: 2 MONTHS  WITH DR. CHERRIE AS SCHEDULED   At the [facility], you and your health needs are our priority. We have a designated team specialized in the treatment of Heart Failure. This Care Team includes your primary Heart Failure Specialized Cardiologist (physician), Advanced Practice Providers (APPs- Physician Assistants and Nurse Practitioners), and Pharmacist who all work together to provide you with the care you need, when you need it.   You may see any of the following providers on your designated Care Team at your next follow up:  Dr. Toribio CHERRIE Dr. Ezra Shuck Dr. Ria Commander Dr. Odis Brownie Greig Mosses, NP Caffie Shed, [place] [facility] [place], [place] Beckey Coe, NP [place] Lee, NP Tinnie Redman, PharmD   Please be sure to bring in all your medications bottles to every appointment.   Need to Contact Us :  If you have any questions or concerns before your next appointment please send us  a message through [place] or call our office at [phone].    TO LEAVE A MESSAGE FOR THE NURSE SELECT OPTION 2, PLEASE LEAVE A MESSAGE INCLUDING: YOUR NAME DATE OF BIRTH CALL BACK NUMBER REASON FOR CALL**this is important as we prioritize the call backs  YOU WILL RECEIVE A CALL BACK THE SAME DAY AS LONG AS YOU CALL BEFORE 4:00 PM

## 2024-08-30 ENCOUNTER — Ambulatory Visit (HOSPITAL_COMMUNITY): Payer: Self-pay | Admitting: Family Medicine

## 2024-08-30 ENCOUNTER — Encounter (HOSPITAL_COMMUNITY): Payer: Self-pay | Admitting: *Deleted

## 2024-08-30 NOTE — Progress Notes (Signed)
 Referral for pulm rehab signed by Dr Cherrie and faxed along with records to [facility] rehab

## 2024-09-16 ENCOUNTER — Telehealth: Payer: Self-pay | Admitting: Student

## 2024-09-16 ENCOUNTER — Ambulatory Visit: Admitting: Cardiology

## 2024-09-16 ENCOUNTER — Encounter (HOSPITAL_COMMUNITY): Payer: Self-pay

## 2024-09-16 NOTE — Telephone Encounter (Signed)
 73 year old male with significant multivessel coronary artery disease status post PCI to OM1, ischemic cardiomyopathy EF 35%, mild RV dysfunction, significant ILD/emphysema and chronic hypoxic respiratory failure complicated by severe pulmonary hypertension.  He calls in for worsening chest pain this evening after running out of his sildenafil.  States that it started when he was walking around the grocery store and has subsided a little bit with rest, he has not taken any sublingual nitro given his concurrent phosphodiesterase inhibitor use.  He went to the pharmacy but this was in [place] and did not have a prescription, the pharmacist gave him 1 sildenafil pill and he is feeling better.  He denies any worsening shortness of breath, orthopnea, PND, lower extremity edema, or syncope.  Weight is stable.  Discussed ED precautions that if chest pain returns or he has worsening shortness of breath, syncope to come to the ED.  Patient needs new prescription sent to [facility] in [place] on [facility].

## 2024-09-17 ENCOUNTER — Other Ambulatory Visit (HOSPITAL_COMMUNITY): Payer: Self-pay

## 2024-09-17 MED ORDER — SILDENAFIL CITRATE 20 MG PO TABS: 20.0000 mg | ORAL_TABLET | Freq: Three times a day (TID) | ORAL | 5 refills | Status: AC

## 2024-09-21 ENCOUNTER — Other Ambulatory Visit (HOSPITAL_COMMUNITY): Payer: Self-pay

## 2024-09-21 ENCOUNTER — Other Ambulatory Visit: Payer: Self-pay

## 2024-09-23 MED ORDER — SACUBITRIL-VALSARTAN 24-26 MG PO TABS: 1.0000 | ORAL_TABLET | Freq: Two times a day (BID) | ORAL | 3 refills | Status: AC

## 2024-10-14 ENCOUNTER — Other Ambulatory Visit (HOSPITAL_COMMUNITY): Payer: Self-pay

## 2024-10-14 ENCOUNTER — Ambulatory Visit: Attending: Cardiology | Admitting: Cardiology

## 2024-10-14 ENCOUNTER — Encounter: Payer: Self-pay | Admitting: Cardiology

## 2024-10-14 VITALS — BP 120/74 | HR 85 | Ht 72.0 in | Wt 268.0 lb

## 2024-10-14 DIAGNOSIS — I5020 Unspecified systolic (congestive) heart failure: Secondary | ICD-10-CM

## 2024-10-14 DIAGNOSIS — I10 Essential (primary) hypertension: Secondary | ICD-10-CM | POA: Diagnosis not present

## 2024-10-14 DIAGNOSIS — I272 Pulmonary hypertension, unspecified: Secondary | ICD-10-CM | POA: Insufficient documentation

## 2024-10-14 DIAGNOSIS — I25118 Atherosclerotic heart disease of native coronary artery with other forms of angina pectoris: Secondary | ICD-10-CM | POA: Diagnosis not present

## 2024-10-14 DIAGNOSIS — E782 Mixed hyperlipidemia: Secondary | ICD-10-CM | POA: Diagnosis not present

## 2024-10-14 MED ORDER — FUROSEMIDE 80 MG PO TABS
80.0000 mg | ORAL_TABLET | ORAL | 3 refills | Status: DC
  Filled 2024-10-14: qty 24, 84d supply, fill #0

## 2024-10-14 NOTE — Progress Notes (Signed)
 Cardiology Office Note:  .   Date:  10/14/2024  ID:  Darrell Simpson, DOB 01-Nov-1951, MRN [id number] PCP: Norval Kettle, MD  [facility] Providers Cardiologist:  Newman Lawrence, MD PCP: Norval Kettle, MD  Chief Complaint  Patient presents with   Dyspnea on exertion     Darrell Simpson is a 73 y.o. male with hypertension, multivessel CAD, HFrEF, former >60 PY smoker, CKD stage 3a, h/o squamous cell skin cancer, exertional dyspnea  History of Present Illness  Patient continues to have severe dyspnea on exertion, requiring 2 L of oxygen .  He has also noticed leg edema recently, but also admits to having eaten fried chicken recently.  He had 1 episode of chest pain when he skipped his evening sildenafil  dose.  Otherwise, he is not having any chest pain episodes.   Vitals:   10/14/24 1452  BP: 120/74  Pulse: 85  SpO2: 91%        Review of Systems  Cardiovascular:  Positive for dyspnea on exertion and leg swelling. Negative for chest pain, palpitations and syncope.        Studies Reviewed: SABRA        EKG 07/28/2024: Sinus rhythm with 1st degree A-V block with occasional Premature ventricular complexes Left axis deviation Left ventricular hypertrophy with QRS widening and repolarization abnormality ( R in aVL , Cornell product , Romhilt-Estes ) Cannot rule out Septal infarct (cited on or before 30-Jan-2021) When compared with ECG of 14-Jun-2024 11:17, Premature ventricular complexes are now Present  Coronary angiography 08/11/2024: LM: Moderately calcified distal 50% stenosis (at least 50% stenosis, accurate severity assessment limited due to calcification, will need IVUS) LAD: Proximal diffuse 50 to 60% severe calcific disease, followed by complete occlusion.          Faint collaterals filling septal vessels from the circumflex.  No clear target noted in LAD for either surgical or percutaneous revascularization. Lcx: Large, codominant vessel.         Ostial/proximal  mildly calcific 75% stenosis at bifurcation with large OM1 vessel with patent stent and 60% ostial in-stent restenosis         Distal circumflex focal 90% stenosis           RCA: Codominant vessel.  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   PET/CT stress test 07/2024:   Severe partially reversible perfusion defect in apical to mid anterior/anteroseptal wall consistent with with infarct with peri-infarct ischemia.  In addition there is partially reversible perfusion defect in inferior wall. LVEF is severely reduced (25%).  TID is present, which is a high risk finding.  Myocardial blood flow reserve is markedly abnormal (1.02), though accuracy can be affected by prior coronary stenting.  Overall, findings suggest severe multivessel CAD and study is high risk.  Cardiac catheterization recommended   LV perfusion is abnormal. There is evidence of ischemia. There is evidence of infarction. Defect 1: There is a medium defect with severe reduction in uptake present in the apical to mid anterior, anteroseptal and apex location(s) that is partially reversible. There is abnormal wall motion  in the defect area. Consistent with peri-infarct ischemia. Defect 2: There is a medium defect with moderate reduction in uptake present in the apical to basal inferior and inferoseptal location(s) that is partially reversible. There is abnormal wall motion in the defect area. Consistent with peri-infarct ischemia.   Rest left ventricular function is abnormal. Rest global function is severely reduced. Rest EF: 25%. Stress left ventricular  function is abnormal. Stress global function is severely reduced. Stress EF: 29%. End diastolic cavity size is moderately enlarged. End systolic cavity size is moderately enlarged.   Myocardial blood flow was computed to be 0.7ml/g/min at rest and 0.22ml/g/min at stress. Global myocardial blood flow reserve was 1.02 and was highly abnormal.   Coronary calcium  assessment not performed due to prior revascularization.   Findings are consistent with infarction with peri-infarct ischemia. The study is high risk.  Echocardiogram 07/2024:  1. Apical hypokinesis. Left ventricular ejection fraction, by estimation,  is 30 to 35%. The left ventricle has moderately decreased function. The  left ventricle demonstrates global hypokinesis. There is mild left  ventricular hypertrophy. Left ventricular diastolic parameters are consistent with Grade I diastolic dysfunction (impaired relaxation).   2. Right ventricular systolic function is mildly reduced. The right  ventricular size is normal.   3. The mitral valve is normal in structure. No evidence of mitral valve  regurgitation. No evidence of mitral stenosis.   4. The aortic valve is normal in structure. Aortic valve regurgitation is  mild. No aortic stenosis is present.   5. Aortic dilatation noted. There is mild dilatation of the ascending  aorta, measuring 42 mm.   6. The inferior vena cava is normal in size with greater than 50%  respiratory variability, suggesting right atrial pressure of 3 mmHg.   Coronary intervention 2022: LM: Distal LM 40% stenosis LAD: Prox occlusion LCx: Severe calcification throughout the vessel         Prox calcific 80% stenosis         OM1 Ostial 60%, followed by prox 90% stenosis   Successful percutaneous coronary intervention OM1 Orbital atherectomy, PTCA and stent placement 3.5 X 34 mm Resolute Onyx drug-eluting stent Attempted percutaneous intervention LCx. Unable to advance stent. Non-flow limiting dissection in  prox Lcx  Echocardiogram 2023: Normal LV systolic function with visual EF 50-55%. Left ventricle cavity is normal in size. Mild left ventricular hypertrophy. Normal global wall motion. Doppler evidence of grade I (impaired) diastolic dysfunction, normal LAP. Mild tricuspid regurgitation. No evidence of pulmonary hypertension. Compared to 04/20/2021 no significant change.  Labs 06/14/2024: Chol 115, TG 113, HDL 43, LDL 51 Cr 1.73, eGFR 51, K 5.2 ProBNP 1302   Physical Exam Vitals and nursing note reviewed.  Constitutional:      General: He is not in acute distress.    Appearance: He is obese.  Neck:     Vascular: No JVD.  Cardiovascular:     Rate and Rhythm: Normal rate and regular rhythm.     Heart sounds: Normal heart sounds. No murmur heard. Pulmonary:     Effort: Pulmonary effort is normal.     Breath sounds: Normal breath sounds. No wheezing or rales.  Musculoskeletal:     Right lower leg: Edema (2+) present.     Left lower leg: Edema (2+) present.      VISIT DIAGNOSES:   ICD-10-CM   1. HFrEF (heart failure with reduced ejection fraction) (HCC)  I50.20 Basic metabolic panel with GFR    Pro b natriuretic peptide (BNP)  Pro b natriuretic peptide (BNP)    Basic metabolic panel with GFR    2. Coronary artery disease of native artery of native heart with stable angina pectoris  I25.118     3. Mixed hyperlipidemia  E78.2     4. Primary hypertension  I10     5. Pulmonary hypertension, unspecified (HCC)  I27.20          Darrell Simpson is a 73 y.o. male with hypertension, multivessel CAD, HFrEF, former >60 PY smoker, CKD stage 3a, h/o squamous cell skin cancer, h/o COVID pneumonia in 11/2020 with residual fibrotic changes, possible interstitial lung disease  Assessment & Plan  Dyspnea on exertion: Likely due to HFrEF and pulmonary hypertension with high PVR. Continue current GDMT including Entresto  24-26 mg twice daily, spironolactone  25 mg daily, Farxiga  10 mg  daily. With bilateral leg edema, increased Lasix  from 80 mg daily to 80 mg twice daily.  Counseled regarding avoiding high salt diet including fried chicken. Check BMP, proBNP in 1 week. Continue sildenafil  20 mg 3 times daily as per Dr. Nelle recommendations. Patient has follow-up with Dr. Bensimhon on 10/28/2024 where there may be further discussion regarding repeat right heart cath.  Defer management of pulmonary hypertension to Dr. Bensimhon.  CAD: Multivessel CAD with LAD CTO.  Severe proximal RCA and distal LM/proximal LCx/OM1 disease, not significantly changed since 2022.  Discussed extensively in multidisciplinary heart team meeting.  We unanimously felt that his dyspnea on exertion symptoms are more likely related to HFrEF and pulmonary hypertension and that he would not benefit from coronary revascularization at this time. Continue current medical management including aspirin  81 mg daily, Lipitor  80 mg daily, Ranexa  1000 mg twice daily, atorvastatin  80 mg daily.  Hypertension: Well-controlled  Hyperlipidemia: Well-controlled.  COPD: Management as per Dr. Alaine        Meds ordered this encounter  Medications   furosemide  (LASIX ) 80 MG tablet    Sig: Take 1 tablet (80 mg total) by mouth 2 (two) times a week.    Dispense:  180 tablet    Refill:  3     F/u in 6-8 weeks  Signed, Newman JINNY Lawrence, MD

## 2024-10-14 NOTE — Patient Instructions (Signed)
 Medication Instructions:  CHANGED Lasix  to 80 mg twice daily   *If you need a refill on your cardiac medications before your next appointment, please call your pharmacy*  Lab Work in 1 week: Bmp Probnp  If you have labs (blood work) drawn today and your tests are completely normal, you will receive your results only by: MyChart Message (if you have MyChart) OR A paper copy in the mail If you have any lab test that is abnormal or we need to change your treatment, we will call you to review the results.   Follow-Up: At [facility], you and your health needs are our priority.  As part of our continuing mission to provide you with exceptional heart care, our providers are all part of one team.  This team includes your primary Cardiologist (physician) and Advanced Practice Providers or APPs (Physician Assistants and Nurse Practitioners) who all work together to provide you with the care you need, when you need it.  Your next appointment:   6 month(s)  Provider:   Newman JINNY Lawrence, MD

## 2024-10-14 NOTE — Telephone Encounter (Signed)
 Order placed for cough syrup per his request

## 2024-10-14 NOTE — Telephone Encounter (Signed)
 Patient is asking for cough medication states he has chest congestion, he coughs up a little bit but won't break loose also states edema is back in his feet and is using furosemide .   Pharamcy - [facility] - [address] [place]

## 2024-10-27 ENCOUNTER — Telehealth (HOSPITAL_COMMUNITY): Payer: Self-pay | Admitting: Internal Medicine

## 2024-10-27 NOTE — Telephone Encounter (Signed)
 Called to confirm/remind patient of their appointment at the [facility] on 10/11/24.   Appointment:   [x] Confirmed  [] Left mess   [] No answer/No voice mail  [] VM Full/unable to leave message  [] Phone not in service  Patient reminded to bring all medications and/or complete list.  Confirmed patient has transportation. Gave directions, instructed to utilize valet parking.

## 2024-10-28 ENCOUNTER — Ambulatory Visit (HOSPITAL_COMMUNITY)
Admission: RE | Admit: 2024-10-28 | Discharge: 2024-10-28 | Disposition: A | Discharge status: Home / Self Care | Source: Ambulatory Visit | Attending: Internal Medicine | Admitting: Internal Medicine

## 2024-10-28 VITALS — BP 88/50 | HR 88 | Wt 259.8 lb

## 2024-10-28 DIAGNOSIS — J439 Emphysema, unspecified: Secondary | ICD-10-CM | POA: Diagnosis not present

## 2024-10-28 DIAGNOSIS — I272 Pulmonary hypertension, unspecified: Secondary | ICD-10-CM | POA: Diagnosis present

## 2024-10-28 DIAGNOSIS — Z955 Presence of coronary angioplasty implant and graft: Secondary | ICD-10-CM | POA: Diagnosis not present

## 2024-10-28 DIAGNOSIS — J84112 Idiopathic pulmonary fibrosis: Secondary | ICD-10-CM | POA: Insufficient documentation

## 2024-10-28 DIAGNOSIS — Z7984 Long term (current) use of oral hypoglycemic drugs: Secondary | ICD-10-CM | POA: Insufficient documentation

## 2024-10-28 DIAGNOSIS — Z79899 Other long term (current) drug therapy: Secondary | ICD-10-CM | POA: Insufficient documentation

## 2024-10-28 DIAGNOSIS — N1832 Chronic kidney disease, stage 3b: Secondary | ICD-10-CM | POA: Diagnosis not present

## 2024-10-28 DIAGNOSIS — I5022 Chronic systolic (congestive) heart failure: Secondary | ICD-10-CM | POA: Diagnosis not present

## 2024-10-28 DIAGNOSIS — R590 Localized enlarged lymph nodes: Secondary | ICD-10-CM | POA: Diagnosis not present

## 2024-10-28 DIAGNOSIS — I130 Hypertensive heart and chronic kidney disease with heart failure and stage 1 through stage 4 chronic kidney disease, or unspecified chronic kidney disease: Secondary | ICD-10-CM | POA: Insufficient documentation

## 2024-10-28 DIAGNOSIS — Z87891 Personal history of nicotine dependence: Secondary | ICD-10-CM | POA: Insufficient documentation

## 2024-10-28 DIAGNOSIS — Z9981 Dependence on supplemental oxygen: Secondary | ICD-10-CM | POA: Insufficient documentation

## 2024-10-28 DIAGNOSIS — I2510 Atherosclerotic heart disease of native coronary artery without angina pectoris: Secondary | ICD-10-CM | POA: Diagnosis not present

## 2024-10-28 DIAGNOSIS — G4733 Obstructive sleep apnea (adult) (pediatric): Secondary | ICD-10-CM | POA: Insufficient documentation

## 2024-10-28 DIAGNOSIS — Z7982 Long term (current) use of aspirin: Secondary | ICD-10-CM | POA: Insufficient documentation

## 2024-10-28 DIAGNOSIS — I5020 Unspecified systolic (congestive) heart failure: Secondary | ICD-10-CM

## 2024-10-28 LAB — BASIC METABOLIC PANEL WITH GFR
Anion gap: 11 (ref 5–15)
BUN: 53 mg/dL — ABNORMAL HIGH (ref 8–23)
CO2: 21 mmol/L — ABNORMAL LOW (ref 22–32)
Calcium: 9 mg/dL (ref 8.9–10.3)
Chloride: 98 mmol/L (ref 98–111)
Creatinine, Ser: 2.22 mg/dL — ABNORMAL HIGH (ref 0.61–1.24)
GFR, Estimated: 31 mL/min — ABNORMAL LOW (ref >60)
Glucose, Bld: 182 mg/dL — ABNORMAL HIGH (ref 70–99)
Potassium: 5.7 mmol/L — ABNORMAL HIGH (ref 3.5–5.1)
Sodium: 129 mmol/L — ABNORMAL LOW (ref 135–145)

## 2024-10-28 LAB — PRO BRAIN NATRIURETIC PEPTIDE: Pro Brain Natriuretic Peptide: 844 pg/mL — ABNORMAL HIGH (ref <300.0)

## 2024-10-28 MED ORDER — SPIRONOLACTONE 25 MG PO TABS: 12.5000 mg | ORAL_TABLET | Freq: Every day | ORAL | 3 refills | Status: AC

## 2024-10-28 MED ORDER — FUROSEMIDE 40 MG PO TABS: 40.0000 mg | ORAL_TABLET | Freq: Every day | ORAL | 5 refills | Status: AC

## 2024-10-28 NOTE — Patient Instructions (Addendum)
 Medication Changes:  DECREASE FUROSEMIDE  TO 40MG  ONCE DAILY   DECREASE SPIRONOLACTONE  TO 12.5MG  ONCE DAILY   Lab Work:  Labs done today, your results will be available in MyChart, we will contact you for abnormal readings.  PLEASE CALL [phone] TO FOLLOW UP ON YOUR CPAP MACHINE (DR. MCQUAID'S OFFICE) PULMONOLGY  Follow-Up in: 1 MONTH AS SCHEDULED WITH APP CLINIC   AND THEN 3 MONTHS WITH DR. CHERRIE WITH AN ECHOCARDIOGRAM   At the [facility], you and your health needs are our priority. We have a designated team specialized in the treatment of Heart Failure. This Care Team includes your primary Heart Failure Specialized Cardiologist (physician), Advanced Practice Providers (APPs- Physician Assistants and Nurse Practitioners), and Pharmacist who all work together to provide you with the care you need, when you need it.   You may see any of the following providers on your designated Care Team at your next follow up:  Dr. Toribio Cherrie Dr. Ezra Shuck Dr. Odis Brownie Greig Mosses, NP Caffie Shed, [place] [facility] [place], [place] Beckey Coe, NP Jordan Lee, NP Tinnie Redman, PharmD   Please be sure to bring in all your medications bottles to every appointment.   Need to Contact Us :  If you have any questions or concerns before your next appointment please send us  a message through [place] or call our office at [phone].    TO LEAVE A MESSAGE FOR THE NURSE SELECT OPTION 2, PLEASE LEAVE A MESSAGE INCLUDING: YOUR NAME DATE OF BIRTH CALL BACK NUMBER REASON FOR CALL**this is important as we prioritize the call backs  YOU WILL RECEIVE A CALL BACK THE SAME DAY AS LONG AS YOU CALL BEFORE 4:00 PM

## 2024-10-28 NOTE — Progress Notes (Signed)
 ADVANCED HF CLINIC CONSULT NOTE   Primary Care: Norval Kettle, MD Primary Cardiologist: Newman JINNY Lawrence, MD HF Cardiologist: Dr. Cherrie  HPI: Mr. Fuson is a 73 y.o. male with past medical history of CAD s/p PCI to OM1 '22, CKD stage IIIa, HFrEF, interstitial lung disease/emphysema on home O2 2 L chronically.  He has been followed by Dr. Lawrence.  Initially seen back in 2022 and underwent heart catheterization found to have multivessel CAD including occluded proximal LAD.  He was evaluated by CT surgery but deemed not to be an appropriate surgical candidate secondary to his lung disease.  He had unsuccessful attempt at PCI to RCA but did undergo successful orbital atherectomy PTCA and stenting to OM1.  Attempts were made to intervene on the circumflex but unable to advance stent which led to nonflow limiting dissection in the proximal circumflex.   Followed by pulmonology, Dr. Alaine at [facility] for history of interstitial lung disease as well as emphysema. He is maintained on home O2 at 2 L, as well as CPAP.   Underwent cardiac PET 07/2024 with severe partially reversible perfusion defect in the apical to mid anterior/anterior septal wall consistent with infarct, peri-infarct ischemia.  Study overall suggestive of severe multivessel CAD and considered high risk with recommendations for cardiac catheterization.  Echo showed EF 30-35%, apical hypokinesis, mild LVH, grade 1 diastolic dysfunction, mildly reduced RV with no significant valvular disease.   Arranged R/LHC by Dr. Lawrence 10/25 due to worsening exertional symptoms. 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 AHF consulted, diuresed and added sildenafil  20 mg tid. Felt not to be a candidate for CABG, IC team decided to manage medically. Hi-Res CT showed evidence of UIP, emphysematous changes, mediastinal lymphadenopathy. GDMT titrated and he was discharged home,  weight 262 lbs.  Today he returns for HF follow up. Saw Dr. Lawrence 2 weeks ago and increased lasix  from 80 daily to 80 bid. Mr. Whitesel reports taking just 80 daily. Feeling better but gets SOB with nearly any exertion. No edema, orthopnea or PND. No orthostasis. Not taking BP regularly. Has not got CPAP yet. Weight down 13 pounds   Cardiac Studies - R/LHC 10/25: severe mvCAD; RA 7, PA 88/38 (50), PCWP 17, CO/CI 3.9/1.6  - Cardiac PET 9/25: severe partially reversible perfusion defect in the apical to mid anterior/anterior septal wall consistent with infarct, peri-infarct ischemia.  Study overall suggestive of severe multivessel CAD and considered high risk with recommendations for cardiac catheterization.  - Echo (9/25): EF 30-35%   Past Medical History:  Diagnosis Date   Arthritis    psioriatic arthritis    Cancer (HCC)    squamous cell skin cancer right cheek    CHF (congestive heart failure) (HCC)    Chronic kidney disease    back to 80% func- improving per pt    Diverticulosis    GERD (gastroesophageal reflux disease)    Hypertension    Current Outpatient Medications  Medication Sig Dispense Refill   albuterol  (VENTOLIN  HFA) 108 (90 Base) MCG/ACT inhaler Inhale 2 puffs into the lungs every 4 (four) hours as needed for wheezing or shortness of breath.     aspirin  81 MG EC tablet Take 1 tablet (81 mg total) by mouth daily. Swallow whole. 30 tablet 11   atorvastatin  (LIPITOR ) 80 MG tablet TAKE 1 TABLET BY MOUTH ONCE DAILY 90 tablet 3   budesonide -glycopyrrolate -formoterol  (BREZTRI  AEROSPHERE) 160-9-4.8 MCG/ACT AERO inhaler Inhale 1 puff into the  lungs daily.     Cyanocobalamin  (VITAMIN B 12) 500 MCG TABS Take by mouth as needed. Pt takes 1-2 x a month     dapagliflozin  propanediol (FARXIGA ) 10 MG TABS tablet Take 1 tablet (10 mg total) by mouth daily before breakfast. 30 tablet 5   furosemide  (LASIX ) 80 MG tablet Take 1 tablet (80 mg total) by mouth 2 (two) times a week. (Patient  taking differently: Take 80 mg by mouth daily.) 180 tablet 3   HYDROcodone -acetaminophen  (NORCO) 10-325 MG tablet Take 1 tablet by mouth every 4 (four) hours as needed for moderate pain.     ipratropium-albuterol  (DUONEB) 0.5-2.5 (3) MG/3ML SOLN Take 3 mLs by nebulization 4 (four) times daily. 360 mL 1   pantoprazole  (PROTONIX ) 40 MG tablet TAKE ONE TABLET BY MOUTH DAILY 90 tablet 3   potassium chloride  SA (KLOR-CON  M) 20 MEQ tablet Take 20 mEq by mouth daily.     ranolazine  (RANEXA ) 1000 MG SR tablet TAKE 1 TABLET BY MOUTH TWICE DAILY 180 tablet 3   sacubitril -valsartan  (ENTRESTO ) 24-26 MG Take 1 tablet by mouth 2 (two) times daily. 180 tablet 3   sildenafil  (REVATIO ) 20 MG tablet Take 1 tablet (20 mg total) by mouth 3 (three) times daily. 90 tablet 5   spironolactone  (ALDACTONE ) 25 MG tablet Take 1 tablet (25 mg total) by mouth daily. 30 tablet 5   SYMBICORT  160-4.5 MCG/ACT inhaler Inhale 2 puffs into the lungs.     Vitamin D, Ergocalciferol, (DRISDOL) 1.25 MG (50000 UNIT) CAPS capsule Take 50,000 Units by mouth once a week.     No current facility-administered medications for this encounter.   Allergies  Allergen Reactions   Penicillins Rash   Sulfa Antibiotics Rash   Social History   Socioeconomic History   Marital status: Married    Spouse name: Not on file   Number of children: 5   Years of education: Not on file   Highest education level: Not on file  Occupational History   Not on file  Tobacco Use   Smoking status: Former    Current packs/day: 0.00    Average packs/day: 1.5 packs/day for 45.0 years (67.5 ttl pk-yrs)    Types: Cigarettes    Start date: 89    Quit date: 2016    Years since quitting: 9.9   Smokeless tobacco: Former  Advertising Account Planner   Vaping status: Former   Start date: 02/08/2015   Quit date: 11/30/2020  Substance and Sexual Activity   Alcohol use: Not Currently    Alcohol/week: 12.0 standard drinks of alcohol    Types: 12 Cans of beer per week     Comment: Quit in 2012   Drug use: Never   Sexual activity: Not on file  Other Topics Concern   Not on file  Social History Narrative   Not on file   Social Drivers of Health   Tobacco Use: Medium Risk (10/14/2024)   Patient History    Smoking Tobacco Use: Former    Smokeless Tobacco Use: Former    Passive Exposure: Not on Stage Manager: Not on file  Food Insecurity: No Food Insecurity (08/12/2024)   Epic    Worried About Programme Researcher, Broadcasting/film/video in the Last Year: Never true    Ran Out of Food in the Last Year: Never true  Transportation Needs: No Transportation Needs (08/12/2024)   Epic    Lack of Transportation (Medical): No    Lack of Transportation (Non-Medical): No  Physical Activity: Not on file  Stress: Not on file  Social Connections: Moderately Integrated (08/12/2024)   Social Connection and Isolation Panel    Frequency of Communication with Friends and Family: Once a week    Frequency of Social Gatherings with Friends and Family: Once a week    Attends Religious Services: 1 to 4 times per year    Active Member of [employer] or Organizations: Yes    Attends Engineer, Structural: More than 4 times per year    Marital Status: Married  Catering Manager Violence: Not At Risk (08/12/2024)   Epic    Fear of Current or Ex-Partner: No    Emotionally Abused: No    Physically Abused: No    Sexually Abused: No  Depression (PHQ2-9): Not on file  Alcohol Screen: Not on file  Housing: Low Risk (08/12/2024)   Epic    Unable to Pay for Housing in the Last Year: No    Number of Times Moved in the Last Year: 0    Homeless in the Last Year: No  Utilities: Not At Risk (08/12/2024)   Epic    Threatened with loss of utilities: No  Health Literacy: Not on file   Family History  Problem Relation Age of Onset   CAD Father    Congestive Heart Failure Father    Heart disease Brother    Colon cancer Neg Hx    Colon polyps Neg Hx    Esophageal cancer Neg Hx    Rectal  cancer Neg Hx    Stomach cancer Neg Hx    Wt Readings from Last 3 Encounters:  10/28/24 117.8 kg (259 lb 12.8 oz)  10/14/24 121.6 kg (268 lb)  08/27/24 123.4 kg (272 lb)   BP (!) 88/50   Pulse 88   Wt 117.8 kg (259 lb 12.8 oz)   SpO2 93%   BMI 35.24 kg/m   PHYSICAL EXAM: General:  Sitting up in chair Mildly dyspneic HEENT: normal Neck: supple. no JVD.  Cor: Regular rate & rhythm. No rubs, gallops or murmurs. Lungs: clear but decreased Abdomen: obese soft, nontender, nondistended.Good bowel sounds. Extremities: no cyanosis, clubbing, rash, edema Neuro: alert & orientedx3, cranial nerves grossly intact. moves all 4 extremities w/o difficulty. 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 Needs to get CPAP started - He has been referred to [facility] in [place]. Needs to start  - Will repeat RHC after CPAP started   2. Chronic HFrEF - ICM.  - Echo (9/25): EF 30-35%, G1DD, mildly reduced RV - RHC (10/25): RA 7, PA 88/38 (50), PCWP 17, CO/CI 3.9/1.6 - Overall mildly improved NYHA III suspect lung disease main driver of symptoms.  - Volume status is low ReDS 24% BP low  - Drop lasix  80 daily to 40 daily - Continue Farxiga  10 mg daily - Decrease spiro 25 mg daily -> 12.5 mg daily - Continue Entresto  24/26 mg bid - no ? blocker with low output on cath - Labs today - See back in 1  month with APP - 3 months with me with ECHO    3. CAD  - h/o PCI to OM - noted severe on PET cardiac CT 9/25 - LHC 10/1: proxLAD  100% stenosis,  - not previously a surgical candidate in 2022 due to ILD/PAH - continues to be poor surgical candidate - No s/s angina  - Continue Ranexa  1g bid - Continue ASA and statin - Followed by Dr. Elmira   4. CKD 3b - Baseline Cr ~1.3-1.6 - Continue SGLT2i - labs today  5. OSA - encouraged him to follow up and get CPAP  Toribio Fuel, MD  2:23 PM

## 2024-10-28 NOTE — Progress Notes (Signed)
 ReDS Vest / Clip - 10/28/24 1400       ReDS Vest / Clip   Station Marker D    Ruler Value 39    ReDS Value Range Low volume    ReDS Actual Value 24

## 2024-10-29 ENCOUNTER — Ambulatory Visit (HOSPITAL_COMMUNITY): Payer: Self-pay | Admitting: Internal Medicine

## 2024-10-29 DIAGNOSIS — I5020 Unspecified systolic (congestive) heart failure: Secondary | ICD-10-CM

## 2024-11-04 ENCOUNTER — Ambulatory Visit: Payer: Self-pay | Admitting: Cardiology

## 2024-11-04 LAB — BASIC METABOLIC PANEL WITH GFR
BUN/Creatinine Ratio: 13 (ref 10–24)
BUN/Creatinine Ratio: 12 (ref 10–24)
BUN: 22 mg/dL (ref 8–27)
BUN: 22 mg/dL (ref 8–27)
CO2: 19 mmol/L — ABNORMAL LOW (ref 20–29)
CO2: 19 mmol/L — ABNORMAL LOW (ref 20–29)
Calcium: 9 mg/dL (ref 8.6–10.2)
Calcium: 8.8 mg/dL (ref 8.6–10.2)
Chloride: 101 mmol/L (ref 96–106)
Chloride: 100 mmol/L (ref 96–106)
Creatinine, Ser: 1.71 mg/dL — ABNORMAL HIGH (ref 0.76–1.27)
Creatinine, Ser: 1.78 mg/dL — ABNORMAL HIGH (ref 0.76–1.27)
Glucose: 85 mg/dL (ref 70–99)
Glucose: 86 mg/dL (ref 70–99)
Potassium: 4.9 mmol/L (ref 3.5–5.2)
Potassium: 4.8 mmol/L (ref 3.5–5.2)
Sodium: 135 mmol/L (ref 134–144)
Sodium: 133 mmol/L — ABNORMAL LOW (ref 134–144)
eGFR: 42 mL/min/1.73 — ABNORMAL LOW
eGFR: 40 mL/min/1.73 — ABNORMAL LOW

## 2024-11-04 LAB — PRO B NATRIURETIC PEPTIDE: NT-Pro BNP: 2204 pg/mL — ABNORMAL HIGH (ref 0–376)

## 2024-11-08 ENCOUNTER — Other Ambulatory Visit: Payer: Self-pay | Admitting: Cardiology

## 2024-11-08 ENCOUNTER — Other Ambulatory Visit (HOSPITAL_COMMUNITY): Payer: Self-pay | Admitting: Family Medicine

## 2024-11-17 ENCOUNTER — Other Ambulatory Visit (HOSPITAL_COMMUNITY): Payer: Self-pay

## 2024-11-17 ENCOUNTER — Telehealth: Payer: Self-pay | Admitting: Pharmacy Technician

## 2024-11-17 ENCOUNTER — Encounter (HOSPITAL_COMMUNITY): Payer: Self-pay

## 2024-11-17 ENCOUNTER — Encounter: Payer: Self-pay | Admitting: Pharmacy Technician

## 2024-11-17 ENCOUNTER — Telehealth: Payer: Self-pay | Admitting: Cardiology

## 2024-11-17 NOTE — Telephone Encounter (Signed)
 PA request has been Submitted. New Encounter has been or will be created for follow up. For additional info see Pharmacy Prior Auth telephone encounter from 11/17/24.

## 2024-11-17 NOTE — Telephone Encounter (Signed)
 Error

## 2024-11-17 NOTE — Telephone Encounter (Signed)
 Pharmacy Patient Advocate Encounter  Received notification from [employer] that Prior Authorization for sildenafil  has been APPROVED from 11/11/24 to 11/10/25   PA #/Case ID/Reference #: [id number]

## 2024-11-17 NOTE — Telephone Encounter (Signed)
 Patient stated he will need a pre-authorization for this medication.

## 2024-11-17 NOTE — Telephone Encounter (Signed)
" °*  STAT* If patient is at the pharmacy, call can be transferred to refill team.   1. Which medications need to be refilled? (please list name of each medication and dose if known)   sildenafil  (REVATIO ) 20 MG tablet   2. Would you like to learn more about the convenience, safety, & potential cost savings by using the [facility]?   3. Are you open to using the [facility] (Type [facility]. ).  4. Which pharmacy/location (including street and city if local pharmacy) is medication to be sent to?  [id number] - [place], [place] - [address]   5. Do they need a 30 day or 90 day supply?   30 day  Patient stated he is completely out of this medication.  Patient has appointment scheduled with Dr. Elmira on 1/22. "

## 2024-11-17 NOTE — Telephone Encounter (Signed)
 Pharmacy Patient Advocate Encounter   Received notification from Pt Calls Messages that prior authorization for sildenafil  is required/requested.   Insurance verification completed.   The patient is insured through [place].   Per test claim: PA required; PA submitted to above mentioned insurance via Latent Key/confirmation #/EOC [id number] Status is pending

## 2024-11-17 NOTE — Telephone Encounter (Signed)
"  error  "

## 2024-11-26 NOTE — Progress Notes (Signed)
 "  ADVANCED HF CLINIC NOTE   Primary Care: Norval Kettle, MD Primary Cardiologist: Newman JINNY Lawrence, MD HF Cardiologist: Dr. Cherrie  HPI: Mr. Woodham is a 74 y.o. male with past medical history of CAD s/p PCI to OM1 '22, CKD stage IIIa, HFrEF, interstitial lung disease/emphysema on home O2 2 L chronically.  He has been followed by Dr. Lawrence.  Initially seen back in 2022 and underwent heart catheterization found to have multivessel CAD including occluded proximal LAD.  He was evaluated by CT surgery but deemed not to be an appropriate surgical candidate secondary to his lung disease.  He had unsuccessful attempt at PCI to RCA but did undergo successful orbital atherectomy PTCA and stenting to OM1.  Attempts were made to intervene on the circumflex but unable to advance stent which led to nonflow limiting dissection in the proximal circumflex.   Followed by pulmonology, Dr. Alaine at [facility] health for history of interstitial lung disease as well as emphysema. He is maintained on home O2 at 2 L, as well as CPAP.   Underwent cardiac PET 07/2024 with severe partially reversible perfusion defect in the apical to mid anterior/anterior septal wall consistent with infarct, peri-infarct ischemia.  Study overall suggestive of severe multivessel CAD and considered high risk with recommendations for cardiac catheterization.  Echo showed EF 30-35%, apical hypokinesis, mild LVH, grade 1 diastolic dysfunction, mildly reduced RV with no significant valvular disease.   Arranged R/LHC by Dr. Lawrence 10/25 due to worsening exertional symptoms. 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 AHF consulted, diuresed and added sildenafil  20 mg tid. Felt not to be a candidate for CABG, IC team decided to manage medically. Hi-Res CT showed evidence of UIP, emphysematous changes, mediastinal lymphadenopathy. GDMT titrated and he was discharged home, weight  262 lbs.  Today he returns for HF follow up. Saw Dr. Lawrence 2 weeks ago and increased lasix  from 80 daily to 80 bid. Mr. Bernhard reports taking just 80 daily. Feeling better but gets SOB with nearly any exertion. No edema, orthopnea or PND. No orthostasis. Not taking BP regularly. Has not got CPAP yet. Weight down 13 pounds   Cardiac Studies - R/LHC 10/25: severe mvCAD; RA 7, PA 88/38 (50), PCWP 17, CO/CI 3.9/1.6  - Cardiac PET 9/25: severe partially reversible perfusion defect in the apical to mid anterior/anterior septal wall consistent with infarct, peri-infarct ischemia.  Study overall suggestive of severe multivessel CAD and considered high risk with recommendations for cardiac catheterization.  - Echo (9/25): EF 30-35%   Past Medical History:  Diagnosis Date   Arthritis    psioriatic arthritis    Cancer (HCC)    squamous cell skin cancer right cheek    CHF (congestive heart failure) (HCC)    Chronic kidney disease    back to 80% func- improving per pt    Diverticulosis    GERD (gastroesophageal reflux disease)    Hypertension    Current Outpatient Medications  Medication Sig Dispense Refill   albuterol  (VENTOLIN  HFA) 108 (90 Base) MCG/ACT inhaler Inhale 2 puffs into the lungs every 4 (four) hours as needed for wheezing or shortness of breath.     aspirin  81 MG EC tablet Take 1 tablet (81 mg total) by mouth daily. Swallow whole. 30 tablet 11   atorvastatin  (LIPITOR ) 80 MG tablet TAKE 1 TABLET BY MOUTH ONCE DAILY 90 tablet 3   budesonide -glycopyrrolate -formoterol  (BREZTRI  AEROSPHERE) 160-9-4.8 MCG/ACT AERO inhaler Inhale 1 puff into the  lungs daily.     Cyanocobalamin  (VITAMIN B 12) 500 MCG TABS Take by mouth as needed. Pt takes 1-2 x a month     FARXIGA  10 MG TABS tablet TAKE ONE TABLET BY MOUTH DAILY BEFORE BREAKFAST 30 tablet 5   furosemide  (LASIX ) 40 MG tablet Take 1 tablet (40 mg total) by mouth daily. 30 tablet 5   HYDROcodone -acetaminophen  (NORCO) 10-325 MG tablet Take 1  tablet by mouth every 4 (four) hours as needed for moderate pain.     ipratropium-albuterol  (DUONEB) 0.5-2.5 (3) MG/3ML SOLN Take 3 mLs by nebulization 4 (four) times daily. 360 mL 1   pantoprazole  (PROTONIX ) 40 MG tablet TAKE ONE TABLET BY MOUTH DAILY 90 tablet 3   potassium chloride  SA (KLOR-CON  M) 20 MEQ tablet Take 20 mEq by mouth daily.     ranolazine  (RANEXA ) 1000 MG SR tablet TAKE 1 TABLET BY MOUTH TWICE DAILY 180 tablet 3   sacubitril -valsartan  (ENTRESTO ) 24-26 MG Take 1 tablet by mouth 2 (two) times daily. 180 tablet 3   sildenafil  (REVATIO ) 20 MG tablet Take 1 tablet (20 mg total) by mouth 3 (three) times daily. 90 tablet 5   spironolactone  (ALDACTONE ) 25 MG tablet Take 0.5 tablets (12.5 mg total) by mouth daily. 45 tablet 3   SYMBICORT  160-4.5 MCG/ACT inhaler Inhale 2 puffs into the lungs.     Vitamin D, Ergocalciferol, (DRISDOL) 1.25 MG (50000 UNIT) CAPS capsule Take 50,000 Units by mouth once a week.     No current facility-administered medications for this visit.   Allergies  Allergen Reactions   Penicillins Rash   Sulfa Antibiotics Rash   Social History   Socioeconomic History   Marital status: Married    Spouse name: Not on file   Number of children: 5   Years of education: Not on file   Highest education level: Not on file  Occupational History   Not on file  Tobacco Use   Smoking status: Former    Current packs/day: 0.00    Average packs/day: 1.5 packs/day for 45.0 years (67.5 ttl pk-yrs)    Types: Cigarettes    Start date: 56    Quit date: 2016    Years since quitting: 10.0   Smokeless tobacco: Former  Advertising Account Planner   Vaping status: Former   Start date: 02/08/2015   Quit date: 11/30/2020  Substance and Sexual Activity   Alcohol use: Not Currently    Alcohol/week: 12.0 standard drinks of alcohol    Types: 12 Cans of beer per week    Comment: Quit in 2012   Drug use: Never   Sexual activity: Not on file  Other Topics Concern   Not on file  Social  History Narrative   Not on file   Social Drivers of Health   Tobacco Use: Medium Risk (10/14/2024)   Patient History    Smoking Tobacco Use: Former    Smokeless Tobacco Use: Former    Passive Exposure: Not on Stage Manager: Not on file  Food Insecurity: No Food Insecurity (08/12/2024)   Epic    Worried About Programme Researcher, Broadcasting/film/video in the Last Year: Never true    Ran Out of Food in the Last Year: Never true  Transportation Needs: No Transportation Needs (08/12/2024)   Epic    Lack of Transportation (Medical): No    Lack of Transportation (Non-Medical): No  Physical Activity: Not on file  Stress: Not on file  Social Connections: Moderately Integrated (08/12/2024)  Social Advertising Account Executive    Frequency of Communication with Friends and Family: Once a week    Frequency of Social Gatherings with Friends and Family: Once a week    Attends Religious Services: 1 to 4 times per year    Active Member of [employer] or Organizations: Yes    Attends Engineer, Structural: More than 4 times per year    Marital Status: Married  Catering Manager Violence: Not At Risk (08/12/2024)   Epic    Fear of Current or Ex-Partner: No    Emotionally Abused: No    Physically Abused: No    Sexually Abused: No  Depression (PHQ2-9): Not on file  Alcohol Screen: Not on file  Housing: Low Risk (08/12/2024)   Epic    Unable to Pay for Housing in the Last Year: No    Number of Times Moved in the Last Year: 0    Homeless in the Last Year: No  Utilities: Not At Risk (08/12/2024)   Epic    Threatened with loss of utilities: No  Health Literacy: Not on file   Family History  Problem Relation Age of Onset   CAD Father    Congestive Heart Failure Father    Heart disease Brother    Colon cancer Neg Hx    Colon polyps Neg Hx    Esophageal cancer Neg Hx    Rectal cancer Neg Hx    Stomach cancer Neg Hx    Wt Readings from Last 3 Encounters:  10/28/24 117.8 kg (259 lb 12.8 oz)   10/14/24 121.6 kg (268 lb)  08/27/24 123.4 kg (272 lb)   There were no vitals taken for this visit.  PHYSICAL EXAM: General:  Sitting up in chair Mildly dyspneic HEENT: normal Neck: supple. no JVD.  Cor: Regular rate & rhythm. No rubs, gallops or murmurs. Lungs: clear but decreased Abdomen: obese soft, nontender, nondistended.Good bowel sounds. Extremities: no cyanosis, clubbing, rash, edema Neuro: alert & orientedx3, cranial nerves grossly intact. moves all 4 extremities w/o difficulty. 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 Needs to get CPAP started - He has been referred to [facility] in [place]. Needs to start  - Will repeat RHC after CPAP started   2. Chronic HFrEF - ICM.  - Echo (9/25): EF 30-35%, G1DD, mildly reduced RV - RHC (10/25): RA 7, PA 88/38 (50), PCWP 17, CO/CI 3.9/1.6 - Overall mildly improved NYHA III suspect lung disease main driver of symptoms.  - Volume status is low ReDS 24% BP low  - Drop lasix  80 daily to 40 daily - Continue Farxiga  10 mg daily - Decrease spiro 25 mg daily -> 12.5 mg daily - Continue Entresto  24/26 mg bid - no ? blocker with low output on cath - Labs today - See back in 1 month with APP - 3 months with me with ECHO    3. CAD  - h/o PCI to OM - noted severe on PET cardiac CT 9/25 - LHC 10/1: proxLAD 100% stenosis,  -  not previously a surgical candidate in 2022 due to ILD/PAH - continues to be poor surgical candidate - No s/s angina  - Continue Ranexa  1g  bid - Continue ASA and statin - Followed by Dr. Elmira   4. CKD 3b - Baseline Cr ~1.3-1.6 - Continue SGLT2i - labs today  5. OSA - encouraged him to follow up and get CPAP  Harlene CHRISTELLA Gainer, FNP  4:10 PM  "

## 2024-11-29 ENCOUNTER — Telehealth (HOSPITAL_COMMUNITY): Payer: Self-pay | Admitting: *Deleted

## 2024-11-29 NOTE — Telephone Encounter (Signed)
 Called to confirm/remind patient of their appointment at the [facility] on 11/30/24:       Appointment:              [x] Confirmed             [] Left mess              [] No answer/No voice mail             [] Phone not in service   Patient reminded to bring all medications and/or complete list.   Confirmed patient has transportation. Gave directions, instructed to utilize valet parking.

## 2024-11-30 ENCOUNTER — Ambulatory Visit (HOSPITAL_COMMUNITY)
Admission: RE | Admit: 2024-11-30 | Discharge: 2024-11-30 | Disposition: A | Discharge status: Home / Self Care | Source: Ambulatory Visit | Attending: Family Medicine | Admitting: Family Medicine

## 2024-11-30 ENCOUNTER — Encounter (HOSPITAL_COMMUNITY): Payer: Self-pay

## 2024-11-30 VITALS — BP 110/60 | HR 89 | Ht 73.0 in | Wt 261.2 lb

## 2024-11-30 DIAGNOSIS — Z87891 Personal history of nicotine dependence: Secondary | ICD-10-CM | POA: Diagnosis not present

## 2024-11-30 DIAGNOSIS — J849 Interstitial pulmonary disease, unspecified: Secondary | ICD-10-CM | POA: Diagnosis not present

## 2024-11-30 DIAGNOSIS — I130 Hypertensive heart and chronic kidney disease with heart failure and stage 1 through stage 4 chronic kidney disease, or unspecified chronic kidney disease: Secondary | ICD-10-CM | POA: Diagnosis not present

## 2024-11-30 DIAGNOSIS — I5020 Unspecified systolic (congestive) heart failure: Secondary | ICD-10-CM

## 2024-11-30 DIAGNOSIS — I25118 Atherosclerotic heart disease of native coronary artery with other forms of angina pectoris: Secondary | ICD-10-CM

## 2024-11-30 DIAGNOSIS — F32A Depression, unspecified: Secondary | ICD-10-CM | POA: Diagnosis not present

## 2024-11-30 DIAGNOSIS — N1832 Chronic kidney disease, stage 3b: Secondary | ICD-10-CM | POA: Insufficient documentation

## 2024-11-30 DIAGNOSIS — I2510 Atherosclerotic heart disease of native coronary artery without angina pectoris: Secondary | ICD-10-CM | POA: Insufficient documentation

## 2024-11-30 DIAGNOSIS — Z9981 Dependence on supplemental oxygen: Secondary | ICD-10-CM | POA: Diagnosis not present

## 2024-11-30 DIAGNOSIS — I5022 Chronic systolic (congestive) heart failure: Secondary | ICD-10-CM | POA: Insufficient documentation

## 2024-11-30 DIAGNOSIS — J439 Emphysema, unspecified: Secondary | ICD-10-CM | POA: Diagnosis not present

## 2024-11-30 DIAGNOSIS — Z7982 Long term (current) use of aspirin: Secondary | ICD-10-CM | POA: Insufficient documentation

## 2024-11-30 DIAGNOSIS — G4733 Obstructive sleep apnea (adult) (pediatric): Secondary | ICD-10-CM | POA: Insufficient documentation

## 2024-11-30 DIAGNOSIS — Z955 Presence of coronary angioplasty implant and graft: Secondary | ICD-10-CM | POA: Diagnosis not present

## 2024-11-30 DIAGNOSIS — R0602 Shortness of breath: Secondary | ICD-10-CM | POA: Diagnosis not present

## 2024-11-30 DIAGNOSIS — I272 Pulmonary hypertension, unspecified: Secondary | ICD-10-CM | POA: Insufficient documentation

## 2024-11-30 DIAGNOSIS — Z79899 Other long term (current) drug therapy: Secondary | ICD-10-CM | POA: Diagnosis not present

## 2024-11-30 DIAGNOSIS — Z7984 Long term (current) use of oral hypoglycemic drugs: Secondary | ICD-10-CM | POA: Diagnosis not present

## 2024-11-30 DIAGNOSIS — N183 Chronic kidney disease, stage 3 unspecified: Secondary | ICD-10-CM

## 2024-11-30 LAB — BASIC METABOLIC PANEL WITH GFR
Anion gap: 12 (ref 5–15)
BUN: 23 mg/dL (ref 8–23)
CO2: 23 mmol/L (ref 22–32)
Calcium: 9 mg/dL (ref 8.9–10.3)
Chloride: 99 mmol/L (ref 98–111)
Creatinine, Ser: 2.15 mg/dL — ABNORMAL HIGH (ref 0.61–1.24)
GFR, Estimated: 32 mL/min — ABNORMAL LOW
Glucose, Bld: 181 mg/dL — ABNORMAL HIGH (ref 70–99)
Potassium: 4.4 mmol/L (ref 3.5–5.1)
Sodium: 134 mmol/L — ABNORMAL LOW (ref 135–145)

## 2024-11-30 LAB — PRO BRAIN NATRIURETIC PEPTIDE: Pro Brain Natriuretic Peptide: 835 pg/mL — ABNORMAL HIGH

## 2024-11-30 NOTE — Patient Instructions (Signed)
 Lab Work:  Labs done today, your results will be available in MyChart, we will contact you for abnormal readings.  Special Instructions // Education:  PLEASE CALL DR. SPURGEON OFFICE ABOUT YOUR CPAP THE NUMBER IS [phone]  Follow-Up in: 2 MONS WITH AN ECHO AND FOLLOW UP WITH DR. CHERRIE   At the [facility], you and your health needs are our priority. We have a designated team specialized in the treatment of Heart Failure. This Care Team includes your primary Heart Failure Specialized Cardiologist (physician), Advanced Practice Providers (APPs- Physician Assistants and Nurse Practitioners), and Pharmacist who all work together to provide you with the care you need, when you need it.   You may see any of the following providers on your designated Care Team at your next follow up:  Dr. Toribio Cherrie Dr. Ezra Shuck Dr. Odis Brownie Greig Mosses, NP Caffie Shed, [place] [facility] [place], [place] Beckey Coe, NP Jordan Lee, NP Tinnie Redman, PharmD   Please be sure to bring in all your medications bottles to every appointment.   Need to Contact Us :  If you have any questions or concerns before your next appointment please send us  a message through [place] or call our office at [phone].    TO LEAVE A MESSAGE FOR THE NURSE SELECT OPTION 2, PLEASE LEAVE A MESSAGE INCLUDING: YOUR NAME DATE OF BIRTH CALL BACK NUMBER REASON FOR CALL**this is important as we prioritize the call backs  YOU WILL RECEIVE A CALL BACK THE SAME DAY AS LONG AS YOU CALL BEFORE 4:00 PM

## 2024-12-01 ENCOUNTER — Ambulatory Visit (HOSPITAL_COMMUNITY): Payer: Self-pay | Admitting: Family Medicine

## 2024-12-01 ENCOUNTER — Telehealth (HOSPITAL_COMMUNITY): Payer: Self-pay | Admitting: *Deleted

## 2024-12-01 DIAGNOSIS — I5020 Unspecified systolic (congestive) heart failure: Secondary | ICD-10-CM

## 2024-12-01 NOTE — Telephone Encounter (Signed)
 Called patient per Harlene Gainer, NP with following:  Renal function up a bit, repeat BMET in 2 weeks to follow.  Pt verbalized understanding of same. Repeat lab ordered and scheduled.

## 2024-12-02 ENCOUNTER — Ambulatory Visit: Payer: Self-pay | Admitting: Cardiology

## 2024-12-02 ENCOUNTER — Encounter: Payer: Self-pay | Admitting: Cardiology

## 2024-12-02 VITALS — BP 92/54 | HR 77 | Ht 72.0 in | Wt 261.9 lb

## 2024-12-02 DIAGNOSIS — I5020 Unspecified systolic (congestive) heart failure: Secondary | ICD-10-CM | POA: Diagnosis not present

## 2024-12-02 DIAGNOSIS — I272 Pulmonary hypertension, unspecified: Secondary | ICD-10-CM

## 2024-12-02 DIAGNOSIS — I25118 Atherosclerotic heart disease of native coronary artery with other forms of angina pectoris: Secondary | ICD-10-CM | POA: Diagnosis not present

## 2024-12-02 NOTE — Progress Notes (Signed)
 " Cardiology Office Note:  .   Date:  12/02/2024  ID:  Darrell Simpson, DOB 06/05/1951, MRN [id number] PCP: Norval Kettle, MD  [facility] Providers Cardiologist:  Newman Lawrence, MD PCP: Norval Kettle, MD  Chief Complaint  Patient presents with   dyspnea on exertion     Darrell Simpson is a 74 y.o. male with hypertension, multivessel CAD, HFrEF, former >60 PY smoker, CKD stage 3a, h/o squamous cell skin cancer, exertional dyspnea  History of Present Illness  Patient was seen by Dr. Cherrie in December 2025, and Harlene Gainer, FNP in 11/2024.  He was recommended continued use of supplemental oxygen  given his ILD, emphysema, and pulmonary hypertension, and was referred to pulmonary rehab in [place].  Dr. Also Recommended Repeating Right Heart Catheterization after Follow-Up with Dr. Mcquaid CPAP Initiation.  Patient's breathing has improved, but exertional dyspnea remains.  He is not currently using oxygen  at baseline, only uses if he feels short of breath.  Leg edema is completely resolved.  Reviewed recent lab results with the patient, details below.  His blood pressure is low today at 92/54 mmHg.  He has not had a follow-up with Dr. Mcquaid and has not started CPAP yet.   Vitals:   12/02/24 1301  BP: (!) 92/54  Pulse: 77  SpO2: 91%      Review of Systems  Cardiovascular:  Positive for dyspnea on exertion and leg swelling. Negative for chest pain, palpitations and syncope.        Studies Reviewed: Darrell Simpson        EKG 07/28/2024: Sinus rhythm with 1st degree A-V block with occasional Premature ventricular complexes Left axis deviation Left ventricular hypertrophy with QRS widening and repolarization abnormality ( R in aVL , Cornell product , Romhilt-Estes ) Cannot rule out Septal infarct (cited on or before 30-Jan-2021) When compared with ECG of 14-Jun-2024 11:17, Premature ventricular complexes are now Present  Coronary angiography 08/11/2024: LM: Moderately calcified  distal 50% stenosis (at least 50% stenosis, accurate severity assessment limited due to calcification, will need IVUS) LAD: Proximal diffuse 50 to 60% severe calcific disease, followed by complete occlusion.          Faint collaterals filling septal vessels from the circumflex.  No clear target noted in LAD for either surgical or percutaneous revascularization. Lcx: Large, codominant vessel.         Ostial/proximal mildly calcific 75% stenosis at bifurcation with large OM1 vessel with patent stent and 60% ostial in-stent restenosis         Distal circumflex focal 90% stenosis           RCA: Codominant vessel.  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   PET/CT stress test 07/2024:   Severe partially reversible perfusion defect in apical to mid anterior/anteroseptal wall consistent with with infarct with peri-infarct ischemia.  In addition there is partially reversible perfusion defect in inferior wall. LVEF is severely reduced (25%).  TID is present, which is a high risk finding.  Myocardial blood flow reserve is markedly abnormal (1.02), though accuracy can be affected by  prior coronary stenting.  Overall, findings suggest severe multivessel CAD and study is high risk.  Cardiac catheterization recommended   LV perfusion is abnormal. There is evidence of ischemia. There is evidence of infarction. Defect 1: There is a medium defect with severe reduction in uptake present in the apical to mid anterior, anteroseptal and apex location(s) that is partially reversible. There is abnormal wall motion in the defect area.  Consistent with peri-infarct ischemia. Defect 2: There is a medium defect with moderate reduction in uptake present in the apical to basal inferior and inferoseptal location(s) that is partially reversible. There is abnormal wall motion in the defect area. Consistent with peri-infarct ischemia.   Rest left ventricular function is abnormal. Rest global function is severely reduced. Rest EF: 25%. Stress left ventricular function is abnormal. Stress global function is severely reduced. Stress EF: 29%. End diastolic cavity size is moderately enlarged. End systolic cavity size is moderately enlarged.   Myocardial blood flow was computed to be 0.70ml/g/min at rest and 0.59ml/g/min at stress. Global myocardial blood flow reserve was 1.02 and was highly abnormal.   Coronary calcium  assessment not performed due to prior revascularization.   Findings are consistent with infarction with peri-infarct ischemia. The study is high risk.  Echocardiogram 07/2024:  1. Apical hypokinesis. Left ventricular ejection fraction, by estimation,  is 30 to 35%. The left ventricle has moderately decreased function. The  left ventricle demonstrates global hypokinesis. There is mild left  ventricular hypertrophy. Left ventricular diastolic parameters are consistent with Grade I diastolic dysfunction (impaired relaxation).   2. Right ventricular systolic function is mildly reduced. The right  ventricular size is normal.   3. The mitral valve is normal in structure. No evidence of mitral valve  regurgitation. No evidence of mitral stenosis.   4. The aortic valve is normal in structure. Aortic valve regurgitation is  mild. No aortic stenosis is present.   5. Aortic dilatation noted. There is mild dilatation of the ascending  aorta, measuring 42 mm.   6. The inferior vena cava is normal in size with greater than 50%  respiratory variability, suggesting right atrial pressure of 3 mmHg.   Coronary intervention 2022: LM: Distal LM  40% stenosis LAD: Prox occlusion LCx: Severe calcification throughout the vessel         Prox calcific 80% stenosis         OM1 Ostial 60%, followed by prox 90% stenosis   Successful percutaneous coronary intervention OM1 Orbital atherectomy, PTCA and stent placement 3.5 X 34 mm Resolute Onyx drug-eluting stent Attempted percutaneous intervention LCx. Unable to advance stent. Non-flow limiting dissection in prox Lcx  Labs 11/2024: Cr 2.15, eGFR 32, Na 134 (Cr/GFR 1.71/42 in 10/2023) ProBNP 835  10/2023: ProBNP 2204 (4453 in 07/2024)   Labs 06/14/2024: Chol 115, TG 113, HDL 43, LDL 51 Cr 1.73, eGFR 51, K 5.2 ProBNP 1302   Physical Exam Vitals and nursing note reviewed.  Constitutional:      General: He is not in acute distress.    Appearance: He is obese.  Neck:     Vascular: No JVD.  Cardiovascular:     Rate and Rhythm: Normal rate and regular rhythm.     Heart sounds: Normal heart sounds. No murmur heard. Pulmonary:     Effort: Pulmonary effort is normal.     Breath sounds: Normal breath sounds. No wheezing or rales.  Musculoskeletal:     Right lower leg: No edema.     Left lower leg:  No edema.      VISIT DIAGNOSES:   ICD-10-CM   1. HFrEF (heart failure with reduced ejection fraction) (HCC)  I50.20     2. Coronary artery disease of native artery of native heart with stable angina pectoris  I25.118     3. Pulmonary hypertension, unspecified (HCC)  I27.20        Darrell Simpson is a 74 y.o. male with hypertension, multivessel CAD, HFrEF, former >60 PY smoker, CKD stage 3a, h/o squamous cell skin cancer, h/o COVID pneumonia in 11/2020 with residual fibrotic changes, possible interstitial lung disease  Assessment & Plan  Dyspnea on exertion: Likely multifactorial in the setting of suspected ILD, HFpEF, pulmonary hypertension. He is very well compensated from heart failure standpoint, in fact appears slightly dry. Blood pressure is low today, recent creatinine  showed mild increase. Recommend holding spironolactone  and Lasix  for 3 days, and then resuming spironolactone  at current dose of 12.5 mg daily, but Lasix  down from 40 mg daily to 20 mg daily. Continue Entresto  24-26 mg twice daily, Farxiga  10 mg daily. Recommend using supplemental oxygen  24/7 in the setting of his suspected interstitial lung disease and pulmonary hypertension. Recommend follow-up with Dr. Mcquaid for CPAP titration. Keep BMP ordered by heart failure clinic in 2 weeks, as well as follow-up with them.  CAD: Multivessel CAD with LAD CTO.  Severe proximal RCA and distal LM/proximal LCx/OM1 disease, not significantly changed since 2022.  Discussed extensively in multidisciplinary heart team meeting.  We unanimously felt that his dyspnea on exertion symptoms are more likely related to HFrEF and pulmonary hypertension and that he would not benefit from coronary revascularization at this time. Continue current medical management including aspirin  81 mg daily, Lipitor  80 mg daily, Ranexa  1000 mg twice daily, atorvastatin  80 mg daily.        No orders of the defined types were placed in this encounter.    F/u in 6 months  Signed, Newman JINNY Lawrence, MD  "

## 2024-12-02 NOTE — Patient Instructions (Signed)
 Medication Instructions:  HOLD Lasix  and Spironolactone  starting 12/03/2024 to 12/05/2024.   Resume Spironolactone  on 12/06/2024 at 12.5 mg daily  Resume Lasix  20 mg daily on 12/06/2024  *If you need a refill on your cardiac medications before your next appointment, please call your pharmacy*   Follow-Up: At [facility], you and your health needs are our priority.  As part of our continuing mission to provide you with exceptional heart care, our providers are all part of one team.  This team includes your primary Cardiologist (physician) and Advanced Practice Providers or APPs (Physician Assistants and Nurse Practitioners) who all work together to provide you with the care you need, when you need it.  Your next appointment:   6 month(s)  Provider:   Newman JINNY Lawrence, MD    We recommend signing up for the patient portal called MyChart.  Sign up information is provided on this After Visit Summary.  MyChart is used to connect with patients for Virtual Visits (Telemedicine).  Patients are able to view lab/test results, encounter notes, upcoming appointments, etc.  Non-urgent messages can be sent to your provider as well.   To learn more about what you can do with MyChart, go to [email or web address].

## 2024-12-16 ENCOUNTER — Ambulatory Visit (HOSPITAL_COMMUNITY)

## 2025-02-21 ENCOUNTER — Other Ambulatory Visit (HOSPITAL_COMMUNITY)

## 2025-02-21 ENCOUNTER — Ambulatory Visit (HOSPITAL_COMMUNITY): Admitting: Internal Medicine

## 2025-05-02 ENCOUNTER — Ambulatory Visit: Admitting: Cardiology
